# Patient Record
Sex: Female | Born: 1970 | Race: White | Hispanic: No | Marital: Married | Smoking: Current every day smoker
Health system: Southern US, Community
[De-identification: ages and names within clinical notes are randomized; demographics above are authoritative.]

## PROBLEM LIST (undated history)

## (undated) DIAGNOSIS — L309 Dermatitis, unspecified: Secondary | ICD-10-CM

## (undated) DIAGNOSIS — IMO0002 Reserved for concepts with insufficient information to code with codable children: Secondary | ICD-10-CM

## (undated) DIAGNOSIS — T7840XA Allergy, unspecified, initial encounter: Secondary | ICD-10-CM

## (undated) DIAGNOSIS — K219 Gastro-esophageal reflux disease without esophagitis: Secondary | ICD-10-CM

## (undated) DIAGNOSIS — F17200 Nicotine dependence, unspecified, uncomplicated: Secondary | ICD-10-CM

## (undated) DIAGNOSIS — Z9289 Personal history of other medical treatment: Secondary | ICD-10-CM

## (undated) DIAGNOSIS — Z01419 Encounter for gynecological examination (general) (routine) without abnormal findings: Secondary | ICD-10-CM

## (undated) DIAGNOSIS — L719 Rosacea, unspecified: Secondary | ICD-10-CM

## (undated) DIAGNOSIS — S0300XA Dislocation of jaw, unspecified side, initial encounter: Secondary | ICD-10-CM

## (undated) HISTORY — DX: Allergy, unspecified, initial encounter: T78.40XA

## (undated) HISTORY — DX: Dislocation of jaw, unspecified side, initial encounter: S03.00XA

## (undated) HISTORY — PX: DILATION AND CURETTAGE OF UTERUS: SHX78

## (undated) HISTORY — PX: KNEE SURGERY: SHX244

## (undated) HISTORY — DX: Reserved for concepts with insufficient information to code with codable children: IMO0002

## (undated) HISTORY — DX: Rosacea, unspecified: L71.9

## (undated) HISTORY — DX: Personal history of other medical treatment: Z92.89

## (undated) HISTORY — DX: Dermatitis, unspecified: L30.9

## (undated) HISTORY — PX: CHOLECYSTECTOMY: SHX55

## (undated) HISTORY — DX: Nicotine dependence, unspecified, uncomplicated: F17.200

## (undated) HISTORY — DX: Gastro-esophageal reflux disease without esophagitis: K21.9

## (undated) HISTORY — DX: Encounter for gynecological examination (general) (routine) without abnormal findings: Z01.419

---

## 2005-12-11 ENCOUNTER — Ambulatory Visit: Payer: Self-pay | Admitting: Family Medicine

## 2006-03-30 ENCOUNTER — Ambulatory Visit: Payer: Self-pay | Admitting: Family Medicine

## 2006-08-13 ENCOUNTER — Other Ambulatory Visit: Admission: RE | Admit: 2006-08-13 | Discharge: 2006-08-13 | Payer: Self-pay | Admitting: *Deleted

## 2006-08-26 ENCOUNTER — Encounter: Admission: RE | Admit: 2006-08-26 | Discharge: 2006-08-26 | Payer: Self-pay | Admitting: *Deleted

## 2006-10-06 ENCOUNTER — Ambulatory Visit: Payer: Self-pay | Admitting: Family Medicine

## 2006-12-29 ENCOUNTER — Ambulatory Visit: Payer: Self-pay | Admitting: Family Medicine

## 2007-04-20 ENCOUNTER — Ambulatory Visit: Payer: Self-pay | Admitting: Family Medicine

## 2007-10-18 ENCOUNTER — Ambulatory Visit: Payer: Self-pay | Admitting: Family Medicine

## 2007-10-18 ENCOUNTER — Other Ambulatory Visit: Admission: RE | Admit: 2007-10-18 | Discharge: 2007-10-18 | Payer: Self-pay | Admitting: *Deleted

## 2007-11-01 ENCOUNTER — Ambulatory Visit: Payer: Self-pay | Admitting: Family Medicine

## 2008-05-12 ENCOUNTER — Ambulatory Visit (HOSPITAL_COMMUNITY): Admission: RE | Admit: 2008-05-12 | Discharge: 2008-05-12 | Payer: Self-pay | Admitting: Orthopaedic Surgery

## 2008-07-25 ENCOUNTER — Ambulatory Visit: Payer: Self-pay | Admitting: Family Medicine

## 2008-09-12 ENCOUNTER — Ambulatory Visit: Payer: Self-pay | Admitting: Family Medicine

## 2008-11-28 ENCOUNTER — Encounter: Admission: RE | Admit: 2008-11-28 | Discharge: 2008-11-28 | Payer: Self-pay | Admitting: Gastroenterology

## 2009-02-23 ENCOUNTER — Ambulatory Visit: Payer: Self-pay | Admitting: Family Medicine

## 2009-08-01 ENCOUNTER — Ambulatory Visit: Payer: Self-pay | Admitting: Family Medicine

## 2009-08-31 ENCOUNTER — Ambulatory Visit: Payer: Self-pay | Admitting: Family Medicine

## 2009-09-28 ENCOUNTER — Ambulatory Visit: Payer: Self-pay | Admitting: Family Medicine

## 2009-10-29 ENCOUNTER — Ambulatory Visit: Payer: Self-pay | Admitting: Family Medicine

## 2010-11-09 ENCOUNTER — Encounter: Payer: Self-pay | Admitting: Family Medicine

## 2010-11-19 NOTE — Op Note (Signed)
Dana Wilkins, Dana Wilkins                 ACCOUNT NO.:  [id number]   MEDICAL RECORD NO.:  [id number]          PATIENT TYPE:  AMB   LOCATION:  SDS                          FACILITY:  [facility]   PHYSICIAN:  Vanita Panda. Magnus Ivan, M.D.DATE OF BIRTH:  August 07, 1970   DATE OF PROCEDURE:  05/12/2008  DATE OF DISCHARGE:  05/12/2008                               OPERATIVE REPORT   PREOPERATIVE DIAGNOSES:  1. Left knee patella chondromalacia.  2. Questionable left knee medial meniscal tear.  3. Mildly laterally tracking patella, left knee.   POSTOPERATIVE DIAGNOSES:  1. 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  2. No medial meniscal tear.  3. Mildly lateral tracking patella.   PROCEDURE:  Left knee arthroscopy with debridement and chondroplasty.   SURGEON:  Vanita Panda. Magnus Ivan, MD   ANESTHESIA:  1. Local left knee block.  2. Mask ventilation and IV sedation.   TOURNIQUET TIME:  None.   BLOOD LOSS:  Minimal.   COMPLICATIONS:  None.   INDICATIONS:  Briefly, Dana Wilkins is a 40 year old female who has been  having increasing worsening knee pain for some period of time.  She has  been on anti-inflammatory and glucosamine that helped.  An MRI showed  that she had lateral facet patella in the inferior pole having worn  collagen in lateral orientation of the patella in terms of subtracking.  There is also suggestion of a medial meniscal tear.  After failure of  conservative treatment, we recommended arthroscopy with debridement and  assessment of the medial meniscus as well.  The risks and benefits of  the surgery were explained to her as well as the possibility of a  lateral release, pending my findings were on during the time of case.  She agreed to proceed with surgery.   DESCRIPTION OF PROCEDURE:  After informed consent was obtained, the  appropriate left leg was marked, anesthesia obtained and left knee  block.  She was then brought to the operating room, placed  supine on the  operating table.  A nonsterile tourniquet was placed around her upper  left leg, and lateral leg post was utilized.  The leg was prepped and  draped with DuraPrep and sterile drapes including sterile stockinette.  With the bed raised, the time-out was called, and she was identified as  the correct patient and correct left knee.  I then made an anterior  lateral arthroscopy portal just lateral to the tendon.  Arthroscope was  inserted.  There was no effusion encountered.  I went right away to the  medial compartment and made an anteromedial arthroscopy portal with the  valgus stress with the knee flexed against the post, and external  rotation was able to probe the posterior horn of the medial meniscus and  found it to be intact.  It was a tight compartment, it was intact, and  there was minimal wear and tear of the cartilage of the femoral condyle  and tibial plateau, and again, this was very minimal.  There was no  exposed bone.  The ACL and PCL were intact  and probed with the knee in  the figure-four position.  The lateral compartment was intact.  There  was significant hypertrophy of the fat pad with the knee extended.  In  extension, you could see that there was buildup of tissue along the  lateral aspect of the gutter and lateral to the patella.  The patella  did track lateral, but this was minimal.  There was grade 2-3  chondromalacia in the patella.  With the arthroscopic shaver, I was able  to perform a chondroplasty over the patella back to smooth margins, and  I performed a minimal lateral release with just using arthroscopic  shaver and was easily able to remove the scar tissue from this area of  the knee.  I did debride the fat pad as well.  I removed all  instrumentation and allowed fluid to lavage the knee.  The portal site  was closed with interrupted 3-0 nylon suture.  The knee was infiltrated  with a mixture of Condyline, morphine, and Marcaine.  A  well-padded  sterile dressing was applied.  She was awakened, extubated, and taken to  recovery room in stable condition.      Vanita Panda. Magnus Ivan, M.D.  Electronically Signed     CYB/MEDQ  D:  05/12/2008  T:  05/12/2008  Job:  [id number]

## 2010-11-26 ENCOUNTER — Other Ambulatory Visit: Payer: Self-pay | Admitting: Gynecology

## 2010-11-26 DIAGNOSIS — R928 Other abnormal and inconclusive findings on diagnostic imaging of breast: Secondary | ICD-10-CM

## 2010-11-29 ENCOUNTER — Ambulatory Visit
Admission: RE | Admit: 2010-11-29 | Discharge: 2010-11-29 | Disposition: A | Discharge status: Home / Self Care | Payer: 59 | Source: Ambulatory Visit | Attending: Gynecology | Admitting: Gynecology

## 2010-11-29 DIAGNOSIS — R928 Other abnormal and inconclusive findings on diagnostic imaging of breast: Secondary | ICD-10-CM

## 2010-12-24 ENCOUNTER — Ambulatory Visit (INDEPENDENT_AMBULATORY_CARE_PROVIDER_SITE_OTHER): Payer: 59 | Admitting: Family Medicine

## 2010-12-24 ENCOUNTER — Encounter: Payer: Self-pay | Admitting: Family Medicine

## 2010-12-24 VITALS — BP 148/84 | HR 80 | Ht 65.5 in | Wt 213.0 lb

## 2010-12-24 DIAGNOSIS — J309 Allergic rhinitis, unspecified: Secondary | ICD-10-CM

## 2010-12-24 DIAGNOSIS — H6120 Impacted cerumen, unspecified ear: Secondary | ICD-10-CM

## 2010-12-24 DIAGNOSIS — H6980 Other specified disorders of Eustachian tube, unspecified ear: Secondary | ICD-10-CM

## 2010-12-24 NOTE — Patient Instructions (Signed)
Sinus rinses 1-2x/day (or Neti-pot).  Antihistamine and decongestant (ie. Claritin-D or Zyrtec-D or take separately).  Mucinex also recommended (plain).  You are at risk for having this turn into a sinus infection.  If your mucus turns discolored, along with increased sinus pain, fever, etc.  Call for antibiotics

## 2010-12-24 NOTE — Progress Notes (Signed)
Subjective:    Patient ID: Francine Graven, female    DOB: 05-16-1971, 40 y.o.   MRN: [id number]  HPI Patient presents with bilateral ear fullness since yesterday.  Having some sinus pressure in her forehead and cheeks.  Ears seem sensitive to certain noises.  Only slight runny nose, mainly draining back into her throat.  Mild cough.  Denies fevers.  Hasn't taken any over-the-counter medications. She has a history of allergies, usually getting 1 sinus infection each summer (August).  Successfully quit smoking with Chantix last year.  There was consolidation at work (people laid off), with a lot of increased stress, and she resumed smoking.  Exercising less, and back to smoking.  Wearing a pedometer and walking 1.5 miles a day, and is now on Weight Watchers.  She is not yet ready to quit smoking again.  Past Medical History  Diagnosis Date  . Allergy   . Tobacco use disorder   . TMJ (dislocation of temporomandibular joint)   . GERD (gastroesophageal reflux disease)     Past Surgical History  Procedure Date  . Cholecystectomy   . Knee surgery     Left--meniscal repair  . Dilation and curettage of uterus     History   Social History  . Marital Status: Married    Spouse Name: N/A    Number of Children: N/A  . Years of Education: N/A   Occupational History  . Not on file.   Social History Main Topics  . Smoking status: Current Everyday Smoker -- 1.0 packs/day for 20 years  . Smokeless tobacco: Never Used  . Alcohol Use: Yes     maybe once a month  . Drug Use: No  . Sexually Active: Not on file   Other Topics Concern  . Not on file   Social History Narrative  . No narrative on file    Family History  Problem Relation Age of Onset  . Cancer Mother     lung (smoker)  . Heart disease Father     CABG @ 17  . Diabetes Maternal Aunt   . Diabetes Paternal Aunt     breast  . Diabetes Maternal Aunt     breast cancer    Current outpatient prescriptions:calcium carbonate  (OS-CAL) 600 MG TABS, Take 600 mg by mouth daily.  , Disp: , Rfl: ;  cyclobenzaprine (FLEXERIL) 10 MG tablet, Take 10 mg by mouth daily.  , Disp: , Rfl: ;  omeprazole (PRILOSEC) 40 MG capsule, Take 40 mg by mouth daily.  , Disp: , Rfl: ;  Probiotic Product (PROBIOTIC ACIDOPHILUS PO), Take 1 tablet by mouth daily.  , Disp: , Rfl:   Allergies  Allergen Reactions  . Amoxicillin Diarrhea and Nausea Only  . Codeine Hives   Review of Systems Denies fevers.  Last week had some nausea-resolved.  No vomiting or diarrhea.  No skin rashes, urinary symptoms. Reflux is controlled    Objective:   Physical Exam BP 148/84  Pulse 80  Ht 5' 5.5" (1.664 m)  Wt 213 lb (96.616 kg)  BMI 34.91 kg/m2  LMP 12/23/2010 Well developed female in no distress.  Not coughing or congested sounding HEENT:  PERRL, EOMI, conjunctiva clear.  Nasal mucosa moderately edematous (L>R) with clear mucus and no erythema. R TM and EAC normal.  L TM partially obscured by cerumen. After lavage, large hard piece of cerumen was removed, and TM was visualized and was entirely normal.  Sinuses mildly tender x 4 Neck: no  lymphadenopathy or thyromegaly Heart:  Regular rate and rhythm without murmurs Lungs: clear bilaterally Skin: no rash Psych:  Normal mood, affect, hygiene and grooming      Assessment & Plan:   1. Cerumen impaction  Ear cerumen removal  2. Allergic rhinitis, cause unspecified    3. ETD (eustachian tube dysfunction)     Sinus rinses 1-2x/day (or Neti-pot).  Antihistamine and decongestant (ie. Claritin-D or Zyrtec-D or take separately).  Mucinex also recommended (plain).  Patient to call if symptoms persist/worsen/turn into full sinus infection, at which point she will need Rx for ABX.  Currently there is no evidence of bacterial infection.  Tobacco abuse--discussed the possibility of another course of Chantix when she is ready.  She can contact us for Rx.  Counseled regarding other smoking cessation options,  including free counseling, and free classes at the [facility].  Risks of smoking reviewed, and strongly encouraged tobacco cessation

## 2010-12-30 ENCOUNTER — Telehealth: Payer: Self-pay | Admitting: *Deleted

## 2010-12-30 DIAGNOSIS — J329 Chronic sinusitis, unspecified: Secondary | ICD-10-CM

## 2010-12-30 MED ORDER — AZITHROMYCIN 250 MG PO TABS: ORAL_TABLET | ORAL | Status: DC

## 2010-12-30 NOTE — Telephone Encounter (Signed)
Please advise pt that ABX are recommended, since symptoms worsening over the last week (and she is at risk for infection due to her smoking).  Please Rx Z-pak (no preferred pharmacy listed)

## 2010-12-30 NOTE — Telephone Encounter (Signed)
Patient called, stated that she had seen you last week for her sinuses. She was instructed to take Mucinex, Zyrtec-D and use a netti pot. She states that she has been taking the medications and did the netti pot on Tuesday evening successfully but starting on Wednesday when she attempted to use the netti pot, she was unable to as the water rolled back down her throat because her sinuses were so blocked. She says she still has sinus pressure in her ears and has had a HA that will not go away since Friday, she is also swollen across the bridge of her nose. Thanks.

## 2011-03-20 ENCOUNTER — Ambulatory Visit (INDEPENDENT_AMBULATORY_CARE_PROVIDER_SITE_OTHER): Payer: 59 | Admitting: Medical

## 2011-03-20 ENCOUNTER — Encounter: Payer: Self-pay | Admitting: Medical

## 2011-03-20 VITALS — BP 120/80 | HR 88 | Temp 98.2°F | Resp 20 | Ht 66.0 in | Wt 213.0 lb

## 2011-03-20 DIAGNOSIS — J309 Allergic rhinitis, unspecified: Secondary | ICD-10-CM

## 2011-03-20 DIAGNOSIS — F17200 Nicotine dependence, unspecified, uncomplicated: Secondary | ICD-10-CM

## 2011-03-20 DIAGNOSIS — J329 Chronic sinusitis, unspecified: Secondary | ICD-10-CM

## 2011-03-20 MED ORDER — CLARITHROMYCIN 500 MG PO TABS: 500.0000 mg | ORAL_TABLET | Freq: Two times a day (BID) | ORAL | Status: AC

## 2011-03-20 NOTE — Progress Notes (Signed)
Subjective:     Dana Wilkins is a 40 y.o. female who presents for evaluation of "sinus infection." She notes few day hx/o sinus pressure, headache, both green discharge and some bloody discharge from nose, nose stopped up, low grade fever, mild cough, swollen throat, and similar pattern in the past with sinusitis.  She also has seasonal allergies this time of year, just recently started back on allergy pill OTC.  She is using Mucinex as well.  She is on once daily Doxycycline for rosacea through dermatology.  Denies sick contacts.  Usually gest this when she travels back and forth to [place], and she was just there recently.  She is a smoker, has hx/o sinus problems.   No other aggravating or relieving factors.  No other c/o.  The following portions of the patient's history were reviewed and updated as appropriate: allergies, current medications, past family history, past medical history, past social history, past surgical history and problem list.  Past Medical History  Diagnosis Date  . Allergy   . Tobacco use disorder   . TMJ (dislocation of temporomandibular joint)   . GERD (gastroesophageal reflux disease)     Review of Systems Constitutional: +LGF; denies chills, sweats, anorexia Skin: denies rash HEENT: denies ear pain, itchy watery eyes Cardiovascular: denies chest pain Lungs: denies wheezing Abdomen: denies abdominal pain, nausea, vomiting, diarrhea GU: denies dysuria  Objective:   Filed Vitals:   03/20/11 1448  BP: 120/80  Pulse: 88  Temp: 98.2 F (36.8 C)  Resp: 20    General appearance: Alert, WD/WN, no distress                             Skin: warm, no rash                           Head: + frontal and maxillary sinus tenderness,                            Eyes: conjunctiva normal, corneas clear, PERRLA                            Ears: pearly TMs, external ear canals normal                          Nose: septum midline, turbinates swollen, left completely  blocked due to turbinate swelling             Mouth/throat: MMM, tongue normal, mild pharyngeal erythema, post nasal drip                           Neck: supple, no adenopathy, no thyromegaly, nontender                          Heart: RRR, normal S1, S2, no murmurs                         Lungs: CTA bilaterally, no wheezes, rales, or rhonchi      Assessment:   Encounter Diagnoses  Name Primary?  . Sinusitis Yes  . Allergic rhinitis   . Tobacco use disorder       Plan:   Advised that time  frame suggest viral etiology.  C/t Mucinex, allergy pill OTC, add nasal saline, rest, hydrate well, can use Afrin for 3 days or less, and if not improving in terms of worse symptoms or fever, over the next 3-5 days, then begin Biaxin.  Otherwise c/t current regimen.   Recommended she c/t efforts to stop smoking, discussed dangers of smoking, and she said both Dr. Susann Givens and Dr. Lynelle Doctor have encouraged her to stop smoking as well.

## 2011-04-08 LAB — CBC
HCT: 44.2
Hemoglobin: 15.3 — ABNORMAL HIGH
MCHC: 34.6
MCV: 89
Platelets: 330
RBC: 4.96
RDW: 13.4
WBC: 8.3

## 2011-04-08 LAB — BASIC METABOLIC PANEL
BUN: 8
CO2: 26
Calcium: 9.9
Chloride: 107
Creatinine, Ser: 0.85
GFR calc Af Amer: >60
GFR calc non Af Amer: >60
Glucose, Bld: 103 — ABNORMAL HIGH
Potassium: 4.4
Sodium: 140

## 2011-04-08 LAB — HCG, SERUM, QUALITATIVE: Preg, Serum: NEGATIVE

## 2011-04-22 ENCOUNTER — Telehealth: Payer: Self-pay | Admitting: Medical

## 2011-04-22 ENCOUNTER — Other Ambulatory Visit: Payer: Self-pay | Admitting: Medical

## 2011-04-22 MED ORDER — NYSTATIN 100000 UNIT/ML MT SUSP: 500000.0000 [IU] | Freq: Four times a day (QID) | OROMUCOSAL | Status: AC

## 2011-04-22 NOTE — Telephone Encounter (Signed)
Nystatin oral solution sent to pharmacy.  Take 1-2 wk, or at least 2 days after last bit of thrush noted in mouth.  Return if not improving.

## 2011-04-22 NOTE — Telephone Encounter (Signed)
PT INFORMED RX AT PHARMACY AND INSTRUCTIONS-LM

## 2011-06-20 ENCOUNTER — Encounter: Payer: Self-pay | Admitting: Family Medicine

## 2011-06-20 ENCOUNTER — Ambulatory Visit (INDEPENDENT_AMBULATORY_CARE_PROVIDER_SITE_OTHER): Payer: 59 | Admitting: Family Medicine

## 2011-06-20 DIAGNOSIS — R51 Headache: Secondary | ICD-10-CM

## 2011-06-20 DIAGNOSIS — J111 Influenza due to unidentified influenza virus with other respiratory manifestations: Secondary | ICD-10-CM

## 2011-06-20 DIAGNOSIS — R059 Cough, unspecified: Secondary | ICD-10-CM

## 2011-06-20 DIAGNOSIS — R509 Fever, unspecified: Secondary | ICD-10-CM

## 2011-06-20 DIAGNOSIS — R05 Cough: Secondary | ICD-10-CM

## 2011-06-20 LAB — POCT INFLUENZA A/B
Influenza A, POC: NEGATIVE
Influenza B, POC: NEGATIVE

## 2011-06-20 NOTE — Progress Notes (Signed)
  Subjective:    Patient ID: Dana Wilkins, female    DOB: [birth year], 40 y.o.   MRN: [id number]  HPI Approximately 3 days ago she developed a fever with chills and myalgias. The cough started the next day. She also had some sore throat and headache..  Review of Systems     Objective:   Physical Exam alert and in no distress. Tympanic membranes and canals are normal. Throat is clear. Tonsils are normal. Neck is supple without adenopathy or thyromegaly. Cardiac exam shows a regular sinus rhythm without murmurs or gallops. Lungs are clear to auscultation. Flu test is negative        Assessment & Plan:  Flu syndrome. Recommend alternating Tylenol with Aleve as well as drink plenty of fluids. She will call if further difficulty.

## 2011-06-20 NOTE — Patient Instructions (Signed)
Alternate Tylenol with 2 Aleve every 4-6 hours. Use NyQuil for nighttime purposes

## 2011-06-25 ENCOUNTER — Telehealth: Payer: Self-pay | Admitting: Family Medicine

## 2011-06-25 NOTE — Telephone Encounter (Signed)
Pt called and stated that she was better but is now coughing. She states that she is coughing up stuff. Pt uses [facility] [address]

## 2011-06-25 NOTE — Telephone Encounter (Signed)
I discussed the cough with the patient. She will continue using Robitussin-DM

## 2011-08-08 HISTORY — PX: COLPOSCOPY: SHX161

## 2011-09-29 ENCOUNTER — Encounter: Payer: Self-pay | Admitting: Medical

## 2011-09-29 ENCOUNTER — Ambulatory Visit (INDEPENDENT_AMBULATORY_CARE_PROVIDER_SITE_OTHER): Payer: 59 | Admitting: Medical

## 2011-09-29 VITALS — BP 138/80 | HR 76 | Temp 98.0°F | Resp 16 | Wt 222.0 lb

## 2011-09-29 DIAGNOSIS — Z7189 Other specified counseling: Secondary | ICD-10-CM

## 2011-09-29 DIAGNOSIS — F17200 Nicotine dependence, unspecified, uncomplicated: Secondary | ICD-10-CM

## 2011-09-29 DIAGNOSIS — Z716 Tobacco abuse counseling: Secondary | ICD-10-CM

## 2011-09-29 MED ORDER — ALPRAZOLAM 0.25 MG PO TABS: ORAL_TABLET | ORAL | Status: DC

## 2011-09-29 MED ORDER — VARENICLINE TARTRATE 0.5 MG X 11 & 1 MG X 42 PO MISC: ORAL | Status: DC

## 2011-09-29 NOTE — Progress Notes (Signed)
Subjective: Would like to try and quit smoking.  In the past she has used Chantix, nicotine patch but this made her sick.  Used Chantix a year and a half ago, had quit for 876mo.  She notes starting tobacco age 41, been smoking 20 years at a pack a day.  She notes that she felt better and had more energy in the past when quit smoking.  Tired of being bound by tobacco, limitations, and ready to get off tobacco.   She notes calling the toll free help line in the past.   She has been preparing her mind for this.  No other c/o.  The following portions of the patient's history were reviewed and updated as appropriate: allergies, current medications, past family history, past medical history, past social history, past surgical history and problem list.  Past Medical History  Diagnosis Date  . Allergy   . Tobacco use disorder   . TMJ (dislocation of temporomandibular joint)   . GERD (gastroesophageal reflux disease)     Allergies  Allergen Reactions  . Amoxicillin Diarrhea and Nausea Only  . Codeine Hives    Review of Systems Heart: negative Lungs: no SOB, wheezing ROS reviewed and was negative other than noted in HPI or above.    Objective:   Physical Exam  General appearance: alert, no distress, WD/WN Heart: RRR, normal S1, S2, no murmurs Lungs: CTA bilaterally, no wheezes, rhonchi, or rales   Assessment and Plan :     Encounter Diagnoses  Name Primary?  . Tobacco use disorder Yes  . Encounter for smoking cessation counseling    She will begin Chantix again, discussed strategies to help quit, advised she call and use the counseling through 1-800-QUIT-NOW.  She seems motivate today.  Recheck 76mo.

## 2011-09-29 NOTE — Patient Instructions (Signed)
YOU CAN QUIT SMOKING!  Talk to your medical provider about using medicines to help you quit. These include nicotine replacement gum, lozenges, or skin patches.  Consider calling 1-800-QUIT-NOW, a toll free 24/7 hotline with free counseling to help you quit.  If you are ready to quit smoking or are thinking about it, congratulations! You have chosen to help yourself be healthier and live longer! There are lots of different ways to quit smoking. Nicotine gum, nicotine patches, a nicotine inhaler, or nicotine nasal spray can help with physical craving. Hypnosis, support groups, and medicines help break the habit of smoking. TIPS TO GET OFF AND STAY OFF CIGARETTES  Learn to predict your moods. Do not let a bad situation be your excuse to have a cigarette. Some situations in your life might tempt you to have a cigarette.   Ask friends and co-workers not to smoke around you.   Make your home smoke-free.   Never have "just one" cigarette. It leads to wanting another and another. Remind yourself of your decision to quit.   On a card, make a list of your reasons for not smoking. Read it at least the same number of times a day as you have a cigarette. Tell yourself everyday, "I do not want to smoke. I choose not to smoke."   Ask someone at home or work to help you with your plan to quit smoking.   Have something planned after you eat or have a cup of coffee. Take a walk or get other exercise to perk you up. This will help to keep you from overeating.   Try a relaxation exercise to calm you down and decrease your stress. Remember, you may be tense and nervous the first two weeks after you quit. This will pass.   Find new activities to keep your hands busy. Play with a pen, coin, or rubber band. Doodle or draw things on paper.   Brush your teeth right after eating. This will help cut down the craving for the taste of tobacco after meals. You can try mouthwash too.   Try gum, breath mints, or diet  candy to keep something in your mouth.  IF YOU SMOKE AND WANT TO QUIT:  Do not stock up on cigarettes. Never buy a carton. Wait until one pack is finished before you buy another.   Never carry cigarettes with you at work or at home.   Keep cigarettes as far away from you as possible. Leave them with someone else.   Never carry matches or a lighter with you.   Ask yourself, "Do I need this cigarette or is this just a reflex?"   Bet with someone that you can quit. Put cigarette money in a piggy bank every morning. If you smoke, you give up the money. If you do not smoke, by the end of the week, you keep the money.   Keep trying. It takes 21 days to change a habit!  Document Released: 04/19/2009 Document Revised: 03/05/2011 Document Reviewed: 04/19/2009 [employer] Patient Information 2012 [employer].   

## 2011-10-31 ENCOUNTER — Encounter: Payer: Self-pay | Admitting: Medical

## 2011-10-31 ENCOUNTER — Ambulatory Visit (INDEPENDENT_AMBULATORY_CARE_PROVIDER_SITE_OTHER): Payer: 59 | Admitting: Medical

## 2011-10-31 VITALS — BP 138/80 | HR 72 | Temp 98.1°F | Resp 16 | Wt 224.0 lb

## 2011-10-31 DIAGNOSIS — R21 Rash and other nonspecific skin eruption: Secondary | ICD-10-CM

## 2011-10-31 DIAGNOSIS — F17200 Nicotine dependence, unspecified, uncomplicated: Secondary | ICD-10-CM

## 2011-10-31 MED ORDER — DOXYCYCLINE HYCLATE 100 MG PO TABS: 100.0000 mg | ORAL_TABLET | Freq: Two times a day (BID) | ORAL | Status: AC

## 2011-10-31 MED ORDER — TRIAMCINOLONE ACETONIDE 0.1 % EX CREA: TOPICAL_CREAM | Freq: Two times a day (BID) | CUTANEOUS | Status: DC

## 2011-10-31 MED ORDER — VARENICLINE TARTRATE 1 MG PO TABS: 1.0000 mg | ORAL_TABLET | Freq: Two times a day (BID) | ORAL | Status: DC

## 2011-10-31 NOTE — Patient Instructions (Signed)
Begin Doxycyline antibioti twice daily for 10 days to cover for infectoin.  Use the triamcinolone cream twice daily for rash.  Keep the area clean with soap and water.  You can take some Benadryl for itching and rash.    I refilled Chantix today.   Continue efforts to remain tobacco free.

## 2011-10-31 NOTE — Progress Notes (Signed)
Subjective:   HPI  Dana Wilkins is a 41 y.o. female who presents with redness on her arm.  She had been working out in the yard on Saturday and Sunday, but early in the week got a spot on her right forearm.  Denies any bite or injury, just noted spot.  She has been working out in the yard, [employer], and has been around [employer] as well.  Denies fever, nausea.  Using alcohol, peroxide, and hydrocortisone cream. Neither seemed to help, but alcohol made any discomfort go away.  Lesion has been itchy.  Was seeping clear fluid yesterday.  She notes prior reaction to poison ivy in remote past.  Last tetanus within 10 years.   Been smoke free 3 wk, still using Chantix.  Needs refill.  This time she is having much better time trying to quit tobacco, less anxiety.    No other c/o.  The following portions of the patient's history were reviewed and updated as appropriate: allergies, current medications, past family history, past medical history, past social history, past surgical history and problem list.  Past Medical History  Diagnosis Date  . Allergy   . Tobacco use disorder   . TMJ (dislocation of temporomandibular joint)   . GERD (gastroesophageal reflux disease)     Allergies  Allergen Reactions  . Amoxicillin Diarrhea and Nausea Only  . Codeine Hives     Review of Systems ROS reviewed and was negative other than noted in HPI or above.    Objective:   Physical Exam  General appearence: alert, no distress, WD/WN Skin: right medial forearm distally with round raised serous colored crusting and erythematous rash.  nonspecific appearing, could be plant dermatitis vs insect bite.  No warmth, induration or fluctuance.   Assessment and Plan :     Encounter Diagnoses  Name Primary?  . Rash Yes  . Tobacco use disorder    Rash nonspecific.  Given symptoms, begin Doxycycline, triamcinolone topically, benadryl OTC and call/return if worse or not improving in next 3-5 days.  C/t  chantix, glad to hear she is having such good success on chantix.

## 2011-11-05 DIAGNOSIS — Z9289 Personal history of other medical treatment: Secondary | ICD-10-CM

## 2011-11-05 HISTORY — DX: Personal history of other medical treatment: Z92.89

## 2011-12-30 ENCOUNTER — Encounter: Payer: Self-pay | Admitting: Medical

## 2011-12-30 ENCOUNTER — Ambulatory Visit (INDEPENDENT_AMBULATORY_CARE_PROVIDER_SITE_OTHER): Payer: 59 | Admitting: Medical

## 2011-12-30 VITALS — BP 122/88 | HR 60 | Temp 98.1°F | Resp 16 | Wt 233.0 lb

## 2011-12-30 DIAGNOSIS — J329 Chronic sinusitis, unspecified: Secondary | ICD-10-CM

## 2011-12-30 MED ORDER — CLARITHROMYCIN 500 MG PO TABS: 500.0000 mg | ORAL_TABLET | Freq: Two times a day (BID) | ORAL | Status: AC

## 2011-12-30 NOTE — Progress Notes (Signed)
Subjective: Here for possible sinus infection.  She reports 56mo hx/o intermittent ear drainage, clear, itchy ears, but in the last week notes sinus pressure, low hum in the ears, painful ears at times, left part of nose and face seems swollen, having tooth pain, worse with leaning head forward, some sneezing, cough, post nasal drip.  Denies chest congestion.  Denies fever, chills, NVD, productive sputum.  Using Zyrtec for symptoms.  Denies sick contacts.  No other aggravating or relieving factors.  No other c/o.   Objective:   Filed Vitals:   12/30/11 1107  BP: 122/88  Pulse: 60  Temp: 98.1 F (36.7 C)  Resp: 16    General appearance: Alert, WD/WN, no distress                             Skin: warm, no rash                           Head: + left maxillary sinus tenderness,                            Eyes: conjunctiva normal, corneas clear, PERRLA                            Ears: pearly TMs, external ear canals normal                          Nose: septum midline, turbinates swollen, with erythema and clear discharge             Mouth/throat: MMM, tongue normal, mild pharyngeal erythema                           Neck: supple, no adenopathy, no thyromegaly, nontender                          Heart: RRR, normal S1, S2, no murmurs                         Lungs: CTA bilaterally, no wheezes, rales, or rhonchi      Assessment and Plan:   Encounter Diagnosis  Name Primary?  . Sinusitis Yes   Prescription given for Biaxin.  Can use OTC Mucinex for congestion.  Tylenol or Ibuprofen OTC for fever and malaise.  Discussed symptomatic relief, nasal saline, and call or return if worse or not improving in 2-3 days.

## 2011-12-31 ENCOUNTER — Encounter: Payer: Self-pay | Admitting: Medical

## 2012-02-23 ENCOUNTER — Ambulatory Visit (INDEPENDENT_AMBULATORY_CARE_PROVIDER_SITE_OTHER): Payer: 59 | Admitting: Medical

## 2012-02-23 ENCOUNTER — Encounter: Payer: Self-pay | Admitting: Medical

## 2012-02-23 ENCOUNTER — Telehealth: Payer: Self-pay | Admitting: Internal Medicine

## 2012-02-23 VITALS — BP 120/80 | HR 60 | Temp 98.1°F | Resp 16 | Wt 230.0 lb

## 2012-02-23 DIAGNOSIS — H60399 Other infective otitis externa, unspecified ear: Secondary | ICD-10-CM

## 2012-02-23 DIAGNOSIS — J329 Chronic sinusitis, unspecified: Secondary | ICD-10-CM

## 2012-02-23 DIAGNOSIS — H6090 Unspecified otitis externa, unspecified ear: Secondary | ICD-10-CM

## 2012-02-23 MED ORDER — CIPROFLOXACIN HCL 500 MG PO TABS: 500.0000 mg | ORAL_TABLET | Freq: Two times a day (BID) | ORAL | Status: AC

## 2012-02-23 MED ORDER — CIPROFLOXACIN-HYDROCORTISONE 0.2-1 % OT SUSP: 3.0000 [drp] | Freq: Two times a day (BID) | OTIC | Status: DC

## 2012-02-23 NOTE — Telephone Encounter (Signed)
Have her come back in.  She was having intermittent ear drainage, but when I saw her the ears looked fine inside.  She may have either swimmers ear or wax issues.  Let me look at them again.  Any fever, pain?

## 2012-02-23 NOTE — Progress Notes (Signed)
  Subjective:   HPI  Dana Wilkins is a 41 y.o. female who presents for ear c/o.  I saw her recently for sinus infection and 1 mo hx/o intermittent ear drainage.  She did get some better with Biaxin, but sinus pressure didn't completley resolve.  Main c/o now though is ongoing bilat ear drainage, odorous and crusty discharge, wakes up with ears and hair crusted.  getting some ear swelling and pain bilat, worse on the left.  Denies any recent swimming, but has had swimmers ear a few times in the past .  No other aggravating or relieving factors.    No other c/o.  The following portions of the patient's history were reviewed and updated as appropriate: allergies, current medications, past family history, past medical history, past social history, past surgical history and problem list.  Past Medical History  Diagnosis Date  . Allergy   . Tobacco use disorder   . TMJ (dislocation of temporomandibular joint)   . GERD (gastroesophageal reflux disease)     Allergies  Allergen Reactions  . Amoxicillin Diarrhea and Nausea Only  . Codeine Hives     Review of Systems ROS reviewed and was negative other than noted in HPI or above.    Objective:   Physical Exam  General appearance: alert, no distress, WD/WN HEENT: normocephalic, no obvious facial or ear swelling or erythema, sclerae anicteric,  bilat ear canals with erythema, crusting drainage, and some purulent debris in both canals, mild swelling of canals, TMs appear normal, left turbinate swollen and red, but otherwise pharynx normal Oral cavity: MMM, no lesions Neck: supple, no lymphadenopathy, no thyromegaly, no masses   Assessment and Plan :     Encounter Diagnoses  Name Primary?  . Otitis externa Yes  . Sinusitis    Begin Cipro oral and Cipro HC drops, hydrate well, Mucinex D OTC, nasal saline.  Call back Friday, return if not improving.

## 2012-02-23 NOTE — Telephone Encounter (Signed)
Pt coming in

## 2012-02-23 NOTE — Patient Instructions (Addendum)
I am treating you for external ear infection and residual sinus infection too.  Begin Cipro HC drops, 3 drops in each ear twice daily.  Avoid getting the ears wet for now.  You can use cotton balls in the external ear canal.  Begin Cipro oral antibiotic as well, twice daily for 5 days.  Drink plenty of water throughout the day.  Use nasal saline flush such as neti pot.  Consider plain Mucinex or Mucinex D twice daily for 5-7 days.    If not improving, let me know.    Otitis Externa Otitis externa ("swimmer's ear") is a germ (bacterial) or fungal infection of the outer ear canal (from the eardrum to the outside of the ear). Swimming in dirty water may cause swimmer's ear. It also may be caused by moisture in the ear from water remaining after swimming or bathing. Often the first signs of infection may be itching in the ear canal. This may progress to ear canal swelling, redness, and pus drainage, which may be signs of infection. HOME CARE INSTRUCTIONS   Apply the antibiotic drops to the ear canal as prescribed by your doctor.   This can be a very painful medical condition. A strong pain reliever may be prescribed.   Only take over-the-counter or prescription medicines for pain, discomfort, or fever as directed by your caregiver.   If your caregiver has given you a follow-up appointment, it is very important to keep that appointment. Not keeping the appointment could result in a chronic or permanent injury, pain, hearing loss and disability. If there is any problem keeping the appointment, you must call back to this facility for assistance.  PREVENTION   It is important to keep your ear dry. Use the corner of a towel to wick water out of the ear canal after swimming or bathing.   Avoid scratching in your ear. This can damage the ear canal or remove the protective wax lining the canal and make it easier for germs (bacteria) or a fungus to grow.   You may use ear drops made of rubbing alcohol and  vinegar after swimming to prevent future "swimmer's ear" infections. Make up a small bottle of equal parts white vinegar and alcohol. Put 3 or 4 drops into each ear after swimming.   Avoid swimming in lakes, polluted water, or poorly chlorinated pools.  SEEK MEDICAL CARE IF:   An oral temperature above 102 F (38.9 C) develops.   Your ear is still painful after 3 days and shows signs of getting worse (redness, swelling, pain, or pus).  MAKE SURE YOU:   Understand these instructions.   Will watch your condition.   Will get help right away if you are not doing well or get worse.  Document Released: 06/23/2005 Document Revised: 06/12/2011 Document Reviewed: 01/28/2008 [facility] Patient Information 2012 [place], [place].

## 2012-02-24 ENCOUNTER — Other Ambulatory Visit: Payer: Self-pay | Admitting: Medical

## 2012-02-24 ENCOUNTER — Telehealth: Payer: Self-pay | Admitting: Internal Medicine

## 2012-02-24 MED ORDER — TOBRAMYCIN-DEXAMETHASONE 0.3-0.1 % OP SUSP: OPHTHALMIC | Status: DC

## 2012-02-24 NOTE — Telephone Encounter (Signed)
Patient was notified that a new medication was sent to the pharmacy. CLS

## 2012-02-24 NOTE — Telephone Encounter (Signed)
i sent a different ear drop

## 2012-04-21 ENCOUNTER — Telehealth: Payer: Self-pay | Admitting: Medical

## 2012-04-21 ENCOUNTER — Other Ambulatory Visit: Payer: Self-pay | Admitting: Medical

## 2012-04-21 MED ORDER — TOBRAMYCIN-DEXAMETHASONE 0.3-0.1 % OP SUSP: OPHTHALMIC | Status: DC

## 2012-04-21 NOTE — Telephone Encounter (Signed)
SHANE SENT SCRIPT TO PHARMACY

## 2012-05-07 DIAGNOSIS — R87619 Unspecified abnormal cytological findings in specimens from cervix uteri: Secondary | ICD-10-CM

## 2012-05-07 DIAGNOSIS — IMO0002 Reserved for concepts with insufficient information to code with codable children: Secondary | ICD-10-CM

## 2012-05-07 DIAGNOSIS — Z01419 Encounter for gynecological examination (general) (routine) without abnormal findings: Secondary | ICD-10-CM

## 2012-05-07 HISTORY — DX: Unspecified abnormal cytological findings in specimens from cervix uteri: R87.619

## 2012-05-07 HISTORY — PX: SHOULDER ARTHROSCOPY: SHX128

## 2012-05-07 HISTORY — DX: Encounter for gynecological examination (general) (routine) without abnormal findings: Z01.419

## 2012-05-07 HISTORY — DX: Reserved for concepts with insufficient information to code with codable children: IMO0002

## 2012-06-07 ENCOUNTER — Ambulatory Visit (INDEPENDENT_AMBULATORY_CARE_PROVIDER_SITE_OTHER): Payer: 59 | Admitting: Medical

## 2012-06-07 ENCOUNTER — Encounter: Payer: Self-pay | Admitting: Medical

## 2012-06-07 VITALS — BP 128/82 | HR 86 | Resp 12 | Ht 65.0 in | Wt 239.0 lb

## 2012-06-07 DIAGNOSIS — L259 Unspecified contact dermatitis, unspecified cause: Secondary | ICD-10-CM

## 2012-06-07 DIAGNOSIS — Z0000 Encounter for general adult medical examination without abnormal findings: Secondary | ICD-10-CM

## 2012-06-07 DIAGNOSIS — H9319 Tinnitus, unspecified ear: Secondary | ICD-10-CM

## 2012-06-07 DIAGNOSIS — L309 Dermatitis, unspecified: Secondary | ICD-10-CM

## 2012-06-07 DIAGNOSIS — E669 Obesity, unspecified: Secondary | ICD-10-CM

## 2012-06-07 DIAGNOSIS — R002 Palpitations: Secondary | ICD-10-CM

## 2012-06-07 DIAGNOSIS — F17200 Nicotine dependence, unspecified, uncomplicated: Secondary | ICD-10-CM

## 2012-06-07 LAB — CBC WITH DIFFERENTIAL/PLATELET
Basophils Absolute: 0.1 10*3/uL (ref 0.0–0.1)
Basophils Relative: 1 % (ref 0–1)
Eosinophils Absolute: 0.3 10*3/uL (ref 0.0–0.7)
Eosinophils Relative: 4 % (ref 0–5)
HCT: 43.8 % (ref 36.0–46.0)
Hemoglobin: 15.2 g/dL — ABNORMAL HIGH (ref 12.0–15.0)
Lymphocytes Relative: 22 % (ref 12–46)
Lymphs Abs: 1.9 10*3/uL (ref 0.7–4.0)
MCH: 28.7 pg (ref 26.0–34.0)
MCHC: 34.7 g/dL (ref 30.0–36.0)
MCV: 82.6 fL (ref 78.0–100.0)
Monocytes Absolute: 0.6 10*3/uL (ref 0.1–1.0)
Monocytes Relative: 7 % (ref 3–12)
Neutro Abs: 5.6 10*3/uL (ref 1.7–7.7)
Neutrophils Relative %: 66 % (ref 43–77)
Platelets: 417 10*3/uL — ABNORMAL HIGH (ref 150–400)
RBC: 5.3 MIL/uL — ABNORMAL HIGH (ref 3.87–5.11)
RDW: 13.7 % (ref 11.5–15.5)
WBC: 8.5 10*3/uL (ref 4.0–10.5)

## 2012-06-07 LAB — TSH: TSH: 1.002 u[IU]/mL (ref 0.350–4.500)

## 2012-06-07 MED ORDER — VARENICLINE TARTRATE 1 MG PO TABS: 1.0000 mg | ORAL_TABLET | Freq: Two times a day (BID) | ORAL | Status: DC

## 2012-06-07 NOTE — Progress Notes (Signed)
Subjective:   HPI  Dana Wilkins is a 41 y.o. female who presents for a complete physical.   Preventative care: Last ophthalmology visit:01/2012 Last dental visit:yes/ Dr. Nedra Hai in 8/13 Last colonoscopy:n/a Last mammogram:11/2011 Last gynecological exam:05/2012, f/u pending with gynecology due to recent abnormal pap Last EKG:n/a Last labs:07/2007  Prior vaccinations: TD or Tdap:8 to 9 years ago Influenza:n/a, declines Pneumococcal:n/a Shingles/Zostavax:n/a  Advanced directive:n/a Health care power of attorney:n/a Living will:n/a  Concerns: Wants to try and quit tobacco again.   Was doing well, had quit for 633mo this year along with Chantix, but given pressures of work and schedule, started back tobacco.  Had vivid dreams, but no other adverse effects.  Still has issues with ears, has drainage.  Gets tonsil stones. Ears at times have itching and discomfort, but not necessarily pain.    Been getting occasional funny beat of heart.  No pain, no SOB, no faint feeling, jut has gotten some of these at random in the last 33mo.  Drinks 3 large cups of coffee daily, sometimes some additional hot tea as well.   Reviewed their medical, surgical, family, social, medication, and allergy history and updated chart as appropriate.   Past Medical History  Diagnosis Date  . Allergy   . Tobacco use disorder   . TMJ (dislocation of temporomandibular joint)   . GERD (gastroesophageal reflux disease)   . Routine gynecological examination 05/2012    Dr. Lorenso Courier  . History of mammogram 5/13    normal  . Abnormal finding on Pap smear 05/2012    Dr. Lorenso Courier  . Eczema     hands  . Rosacea     Dr. Terri Piedra    Past Surgical History  Procedure Date  . Cholecystectomy   . Knee surgery     Left--meniscal repair  . Dilation and curettage of uterus   . Shoulder arthroscopy 05/2012    left, Dr. Rayburn Ma, [facility]  . Colposcopy 2/13  . Colonoscopy     never    Family History  Problem  Relation Age of Onset  . Cancer Mother     lung (smoker)  . Heart disease Father     CABG @ 98  . Diabetes Maternal Aunt   . Diabetes Paternal Aunt     breast  . Diabetes Maternal Aunt     breast cancer  . Stroke Neg Hx   . Hypertension Neg Hx     History   Social History  . Marital Status: Married    Spouse Name: N/A    Number of Children: N/A  . Years of Education: N/A   Occupational History  . Not on file.   Social History Main Topics  . Smoking status: Current Every Day Smoker -- 1.0 packs/day for 20 years  . Smokeless tobacco: Never Used  . Alcohol Use: 0.0 oz/week    0 drink(s) per week     Comment: maybe once a month  . Drug Use: No  . Sexually Active: Not on file   Other Topics Concern  . Not on file   Social History Narrative   Married, no children, walking twice weekly.  Works as Theatre stage manager     Current Outpatient Prescriptions on File Prior to Visit  Medication Sig Dispense Refill  . calcium carbonate (OS-CAL) 600 MG TABS Take 600 mg by mouth daily.        Marland Kitchen ibuprofen (ADVIL,MOTRIN) 200 MG tablet Take 200 mg by mouth every 6 (six) hours as  needed.      . loratadine (CLARITIN) 10 MG tablet Take 10 mg by mouth daily.      Marland Kitchen omeprazole (PRILOSEC) 40 MG capsule Take 40 mg by mouth daily.        . Probiotic Product (ALIGN PO) Take by mouth.      . tobramycin-dexamethasone (TOBRADEX) ophthalmic solution Use in both ears, 2 drops every 4 hours in each ear for 2 days, then every 6 hours x 5 days  10 mL  0    Allergies  Allergen Reactions  . Amoxicillin Diarrhea and Nausea Only  . Codeine Hives    Review of Systems Constitutional: -fever, -chills, -sweats, +unexpected weight change, -decreased appetite, -fatigue Allergy: +sneezing, -itching, +congestion Dermatology: -changing moles, --rash, -lumps ENT: -runny nose, +ear pain, -sore throat, -hoarseness, -sinus pain, -teeth pain, - ringing in ears, -hearing loss, -nosebleeds Cardiology: -chest pain,  -palpitations, -swelling, -difficulty breathing when lying flat, -waking up short of breath Respiratory: +cough, -shortness of breath, -difficulty breathing with exercise or exertion, -wheezing, -coughing up blood Gastroenterology: -abdominal pain, -nausea, -vomiting, -diarrhea, -constipation, -blood in stool, -changes in bowel movement, -difficulty swallowing or eating Hematology: -bleeding, -bruising  Musculoskeletal: +joint aches, -muscle aches, -joint swelling, -back pain, -neck pain, -cramping, -changes in gait Ophthalmology: denies vision changes, eye redness, itching, discharge Urology: -burning with urination, -difficulty urinating, -blood in urine, -urinary frequency, -urgency, -incontinence Neurology: +headache, -weakness, -tingling, -numbness, -memory loss, -falls, -dizziness Psychology: -depressed mood, -agitation, -sleep problems     Objective:   Physical Exam  Filed Vitals:   06/07/12 0829  BP: 128/82  Pulse: 86  Resp: 12    General appearance: alert, no distress, WD/WN, white female Skin: scattered benign appearing macules, bilat external ear canals with mild erythema and flaking, suggestive of eczema, no pus, no odor, no obvious otitis external HEENT: normocephalic, conjunctiva/corneas normal, sclerae anicteric, PERRLA, EOMi, nares patent, no discharge or erythema, pharynx normal Oral cavity: MMM, tongue normal, teeth in good repair Neck: supple, no lymphadenopathy, no thyromegaly, no masses, normal ROM, no bruits Chest: non tender, normal shape and expansion Heart: RRR, normal S1, S2, no murmurs Lungs: CTA bilaterally, no wheezes, rhonchi, or rales Abdomen: +bs, soft, non tender, non distended, no masses, no hepatomegaly, no splenomegaly, no bruits Back: non tender, normal ROM, no scoliosis Musculoskeletal: upper extremities non tender, no obvious deformity, normal ROM throughout, lower extremities non tender, no obvious deformity, normal ROM throughout Extremities:  no edema, no cyanosis, no clubbing Pulses: 2+ symmetric, upper and lower extremities, normal cap refill Neurological: alert, oriented x 3, CN2-12 intact, strength normal upper extremities and lower extremities, sensation normal throughout, DTRs 2+ throughout, no cerebellar signs, gait normal Psychiatric: normal affect, behavior normal, pleasant  Breast, gyn, rectal - deferred to gynecology   Adult ECG Report  Indication: palpitation  Rate: 78 bpm  Rhythm: normal sinus rhythm  QRS Axis: 14 degrees  PR Interval: 128 ms  QRS Duration: 94 ms  QTc: 433 ms  Conduction Disturbances: none  Other Abnormalities: ST depression II rhythm lead out of place and not in other leads, seems to be anomaly  Patient's cardiac risk factors are: obesity (BMI >= 30 kg/m2) and smoking/ tobacco exposure.  EKG comparison: none  Narrative Interpretation: normal EKG   Assessment and Plan :    Encounter Diagnoses  Name Primary?  . Routine general medical examination at a health care facility Yes  . Palpitations   . Tobacco use disorder   . Tinnitus   . Eczema   .  Obesity     Physical exam - discussed healthy lifestyle, diet, exercise, preventative care, vaccinations, and addressed their concerns.  Handout given.  Palpitations - EKG unremarkable.  Advised she gradually back off caffeine.  Drinks a lot of caffeine currently.  If palpitations worsen or get more frequent, then recheck.  Tobacco use - restart chantix, try and keep discipline approach to quit.  She was having success earlier this year.  counseled on tobacco cessation.  tinnitus - mild, occasional, hearing test normal.  If problem persists, consider nasal spray as trial.  C/t Claritin for now.  Eczema - followed by dermatology about hands, but eternal ear canals have rash suggestive of eczema.  Try OTC Cortaid for this for the next week and see how she responds.  Obesity - she had done well prior this past year on weight watchers.  Encouraged  her to get things back in gear with exercise, diet, consider weight watchers again.  Follow-up pending labs

## 2012-06-07 NOTE — Patient Instructions (Signed)

## 2012-06-08 LAB — COMPREHENSIVE METABOLIC PANEL
ALT: 16 U/L (ref 0–35)
AST: 14 U/L (ref 0–37)
Albumin: 4.4 g/dL (ref 3.5–5.2)
Alkaline Phosphatase: 47 U/L (ref 39–117)
BUN: 10 mg/dL (ref 6–23)
CO2: 24 mEq/L (ref 19–32)
Calcium: 9.7 mg/dL (ref 8.4–10.5)
Chloride: 105 mEq/L (ref 96–112)
Creat: 0.85 mg/dL (ref 0.50–1.10)
Glucose, Bld: 96 mg/dL (ref 70–99)
Potassium: 5.3 mEq/L (ref 3.5–5.3)
Sodium: 141 mEq/L (ref 135–145)
Total Bilirubin: 0.4 mg/dL (ref 0.3–1.2)
Total Protein: 6.6 g/dL (ref 6.0–8.3)

## 2012-06-08 LAB — LIPID PANEL
Cholesterol: 180 mg/dL (ref 0–200)
HDL: 35 mg/dL — ABNORMAL LOW (ref >39)
LDL Cholesterol: 120 mg/dL — ABNORMAL HIGH (ref 0–99)
Total CHOL/HDL Ratio: 5.1 Ratio
Triglycerides: 124 mg/dL (ref <150)
VLDL: 25 mg/dL (ref 0–40)

## 2012-06-08 LAB — VITAMIN D 25 HYDROXY (VIT D DEFICIENCY, FRACTURES): Vit D, 25-Hydroxy: 33 ng/mL (ref 30–89)

## 2012-08-02 ENCOUNTER — Encounter: Payer: Self-pay | Admitting: Family Medicine

## 2012-08-02 ENCOUNTER — Ambulatory Visit (INDEPENDENT_AMBULATORY_CARE_PROVIDER_SITE_OTHER): Payer: BC Managed Care – PPO | Admitting: Family Medicine

## 2012-08-02 VITALS — BP 140/80 | HR 64 | Temp 98.6°F | Ht 65.5 in | Wt 243.0 lb

## 2012-08-02 DIAGNOSIS — R300 Dysuria: Secondary | ICD-10-CM

## 2012-08-02 LAB — POCT URINALYSIS DIPSTICK
Bilirubin, UA: NEGATIVE
Blood, UA: NEGATIVE
Glucose, UA: NEGATIVE
Ketones, UA: NEGATIVE
Nitrite, UA: NEGATIVE
Protein, UA: NEGATIVE
Spec Grav, UA: 1.01
Urobilinogen, UA: NEGATIVE
pH, UA: 7

## 2012-08-02 MED ORDER — CIPROFLOXACIN HCL 250 MG PO TABS: 250.0000 mg | ORAL_TABLET | Freq: Two times a day (BID) | ORAL | Status: DC

## 2012-08-02 NOTE — Patient Instructions (Signed)
Take the antibiotics twice daily for at least 3 days (take for the full 5 days--stop at 3 only if not tolerating due to side effects, or if we call with your results prior). Drink plenty of fluids  Return if you develop fever, flank pain, or vomiting

## 2012-08-02 NOTE — Progress Notes (Signed)
Chief Complaint  Patient presents with  . Urinary Tract Infection    woke up Saturday morning with burning with urination. Low back pain, urgency and frequency. UA showed trace leuks.  Very nauseous today and somewhat yesterday.   HPI:  Woke up 2 mornings ago to go to bathroom, and noted some low back pain, dysuria, urgency.  She has had some ongoing urinary urgency and frequency.  Not as painful when she voids as it was on the first day.  Ongoing nausea since Saturday.  No vomiting, no diarrhea.  No devers.  No sick contacts--husband recently got over a GI bug.  H/o UTI in past, but very infrequent, not recent.  Feels similar to previous UTI. Denies abnormal vaginal discharge--no odor or itching.  Monogamous relationship  Past Medical History  Diagnosis Date  . Allergy   . Tobacco use disorder     quit 06/2012  . TMJ (dislocation of temporomandibular joint)   . GERD (gastroesophageal reflux disease)   . Routine gynecological examination 05/2012    Dr. Lorenso Courier  . History of mammogram 5/13    normal  . Abnormal finding on Pap smear 05/2012    Dr. Lorenso Courier  . Eczema     hands  . Rosacea     Dr. Terri Piedra   Past Surgical History  Procedure Date  . Cholecystectomy   . Knee surgery     Left--meniscal repair  . Dilation and curettage of uterus   . Shoulder arthroscopy 05/2012    left, Dr. Rayburn Ma, [facility]  . Colposcopy 2/13   History   Social History  . Marital Status: Married    Spouse Name: N/A    Number of Children: N/A  . Years of Education: N/A   Occupational History  . Not on file.   Social History Main Topics  . Smoking status: Former Smoker -- 1.0 packs/day for 20 years    Start date: 06/22/2012  . Smokeless tobacco: Never Used  . Alcohol Use: 0.0 oz/week    0 drink(s) per week     Comment: maybe once a month  . Drug Use: No  . Sexually Active: Not on file   Other Topics Concern  . Not on file   Social History Narrative   Married, no  children, walking twice weekly.  Works as Theatre stage manager    Current outpatient prescriptions:Calcium-Vitamin D-Vitamin K 500-100-40 MG-UNT-MCG CHEW, Chew 1 each by mouth daily., Disp: , Rfl: ;  clobetasol cream (TEMOVATE) 0.05 %, Apply 1 application topically as needed., Disp: , Rfl: ;  cyclobenzaprine (FLEXERIL) 10 MG tablet, Take 10 mg by mouth 3 (three) times daily as needed., Disp: , Rfl: ;  ibuprofen (ADVIL,MOTRIN) 200 MG tablet, Take 200 mg by mouth every 6 (six) hours as needed., Disp: , Rfl:  loratadine (CLARITIN) 10 MG tablet, Take 10 mg by mouth daily., Disp: , Rfl: ;  metroNIDAZOLE (METROGEL) 0.75 % gel, Apply topically daily., Disp: , Rfl: ;  omeprazole (PRILOSEC) 40 MG capsule, Take 40 mg by mouth daily.  , Disp: , Rfl: ;  Probiotic Product (ALIGN PO), Take by mouth., Disp: , Rfl: ;  ciprofloxacin (CIPRO) 250 MG tablet, Take 1 tablet (250 mg total) by mouth 2 (two) times daily., Disp: 10 tablet, Rfl: 0 (Cipro just started after this visit)  Allergies  Allergen Reactions  . Amoxicillin Diarrhea and Nausea Only  . Codeine Hives   ROS:  Denies fevers, flank pain, vomiting, diarrhea (+nause).  +low back pain.  Denies  skin rash, chest pain, headaches, dizziness, vaginal discharge, bleeding, bruising, rash or other concerns.  PHYSICAL EXAM: BP 140/80  Pulse 64  Temp 98.6 F (37 C) (Oral)  Ht 5' 5.5" (1.664 m)  Wt 243 lb (110.224 kg)  BMI 39.82 kg/m2  LMP 06/28/2012 Well developed, pleasant female in no distress Back: no CVA tenderness.  Area of discomfort is in paraspinous muscles.   Heart: regular rate and rhythm Lungs: clear Abdomen: soft, nontender, no mass Skin: no rash Psych: normal mood, affect, hygiene and grooming  Urine dip: trace leuks, SG 1.010. Otherwise negative.  ASSESSMENT/PLAN: 1. Burning with urination  POCT Urinalysis Dipstick, Urine culture, ciprofloxacin (CIPRO) 250 MG tablet   Symptoms consistent with UTI, but dip only minimally abnormal.  Will treat  presumptively with cipro while awaiting urine culture results.  Risks/side effects reviewed.  Call/return if fevers, vomiting, blank pain, hematuria or other concerns develop before culture results return  Use tylenol or ibuprofen as needed for pain, heat prn low back pain.

## 2012-08-05 LAB — URINE CULTURE: Colony Count: >=100000

## 2013-01-28 ENCOUNTER — Ambulatory Visit (INDEPENDENT_AMBULATORY_CARE_PROVIDER_SITE_OTHER): Payer: BC Managed Care – PPO | Admitting: Medical

## 2013-01-28 ENCOUNTER — Encounter: Payer: Self-pay | Admitting: Medical

## 2013-01-28 VITALS — BP 118/80 | HR 76 | Temp 98.1°F | Resp 16 | Wt 248.0 lb

## 2013-01-28 DIAGNOSIS — M62830 Muscle spasm of back: Secondary | ICD-10-CM

## 2013-01-28 DIAGNOSIS — S39012A Strain of muscle, fascia and tendon of lower back, initial encounter: Secondary | ICD-10-CM

## 2013-01-28 DIAGNOSIS — M5380 Other specified dorsopathies, site unspecified: Secondary | ICD-10-CM

## 2013-01-28 DIAGNOSIS — IMO0002 Reserved for concepts with insufficient information to code with codable children: Secondary | ICD-10-CM

## 2013-01-28 MED ORDER — CYCLOBENZAPRINE HCL 10 MG PO TABS: ORAL_TABLET | ORAL | Status: DC

## 2013-01-28 NOTE — Progress Notes (Signed)
Subjective: Here for neck and upper back pain since Monday.    When driving and accelerating, feels catch in back on right between shoulder blade.  Pain radiates up her neck.   This morning felt throat was irritated.  couldn't sleep good or get into bed last night due to neck pain, can't bring chin to chest.   No arm pain, weakness,numbness, tingling.   No low back pain.  She was cleaning the doors in her house this past weekend, denies sudden onset pain with activity though.  No other aggravating or relieving factors.  Past Medical History  Diagnosis Date  . Allergy   . Tobacco use disorder     quit 06/2012  . TMJ (dislocation of temporomandibular joint)   . GERD (gastroesophageal reflux disease)   . Routine gynecological examination 05/2012    Dr. Lorenso Courier  . History of mammogram 5/13    normal  . Abnormal finding on Pap smear 05/2012    Dr. Lorenso Courier  . Eczema     hands  . Rosacea     Dr. Terri Piedra   ROS as in subjective  Objective: Filed Vitals:   01/28/13 1618  BP: 118/80  Pulse: 76  Temp: 98.1 F (36.7 C)  Resp: 16    General appearance: alert, no distress, WD/WN Neck: supple, tender bilat neck, ROM about 70% of normal, no lymphadenopathy, no thyromegaly, no masses Back: tender bilat upper back paraspinal, +spasm, otherwise back nontender, normal ROM, no deformity MSK: bilat UE nontneder, normal ROM Pulses normal  Neuro: UE normal strength, sensation, DTRs.   Assessment: Encounter Diagnoses  Name Primary?  . Back strain, initial encounter Yes  . Back muscle spasm      Plan: Advised of diagnosis, possible activities that contributed.  Begin flexeril, heat, stretching and ROM activity after few days of rest, can use 2 Aleve BID OTC the next several days.  She has Ultram at home prn for worse pain.   Should resolve in 5-7 days.  Follow-up if worse or not improving.

## 2013-12-28 ENCOUNTER — Ambulatory Visit (INDEPENDENT_AMBULATORY_CARE_PROVIDER_SITE_OTHER): Payer: BC Managed Care – PPO | Admitting: Medical

## 2013-12-28 ENCOUNTER — Encounter: Payer: Self-pay | Admitting: Medical

## 2013-12-28 VITALS — BP 140/90 | HR 68 | Temp 98.0°F | Resp 14 | Wt 234.0 lb

## 2013-12-28 DIAGNOSIS — D582 Other hemoglobinopathies: Secondary | ICD-10-CM

## 2013-12-28 DIAGNOSIS — R5383 Other fatigue: Secondary | ICD-10-CM

## 2013-12-28 DIAGNOSIS — R030 Elevated blood-pressure reading, without diagnosis of hypertension: Secondary | ICD-10-CM

## 2013-12-28 DIAGNOSIS — R5382 Chronic fatigue, unspecified: Secondary | ICD-10-CM

## 2013-12-28 DIAGNOSIS — M7989 Other specified soft tissue disorders: Secondary | ICD-10-CM

## 2013-12-28 DIAGNOSIS — R5381 Other malaise: Secondary | ICD-10-CM

## 2013-12-28 DIAGNOSIS — F17200 Nicotine dependence, unspecified, uncomplicated: Secondary | ICD-10-CM

## 2013-12-28 LAB — BASIC METABOLIC PANEL
BUN: 11 mg/dL (ref 6–23)
CO2: 27 mEq/L (ref 19–32)
Calcium: 9.7 mg/dL (ref 8.4–10.5)
Chloride: 105 mEq/L (ref 96–112)
Creat: 0.74 mg/dL (ref 0.50–1.10)
Glucose, Bld: 90 mg/dL (ref 70–99)
Potassium: 5.1 mEq/L (ref 3.5–5.3)
Sodium: 140 mEq/L (ref 135–145)

## 2013-12-28 MED ORDER — VARENICLINE TARTRATE 0.5 MG X 11 & 1 MG X 42 PO MISC: ORAL | Status: DC

## 2013-12-28 NOTE — Progress Notes (Signed)
    Subjective: Patient is a 43 y.o. female presenting for check thyroid, cbc and smoking cessation.  She recent had routine annual gyn visit on Monday, Hemoglobin was elevated. Was told that they would fax results to our practice. Was also advised to have thyroid check. Has felt tired, a bit fatigued at night, occasional palpations, some nausea and occasional loose stool, swelling in her hands, occasional tingling in hands. Admits stress at work since November and is actually starting a new job Monday. Denies having dry skin, coarse hair, heart racing, chest pain, abdominal pain, edema.   Smoking cessation - tried Chantix before, two years ago.   Had quit for about a year.  She actually quit smoking for a year but relapsed with all the stress she's going through at work. She admits that she had vivid dreams while on Chantix but she otherwise tolerated it very well. Wants to restart Chantix to help her quit smoking again.  Currently smoking 1ppd.   No recent diet changes, no recent lifestyle changes other than healthier low salt diet as husband was told to cahnge to low salt given his health.    Review of Systems Constitutional: -fever, -chills, -sweats, -unexpected weight change,+fatigue ENT: -runny nose, -ear pain, -sore throat Cardiology:  -chest pain, +palpitations, +hand edema she attributes to eczema Respiratory: -cough, -shortness of breath, -wheezing Gastroenterology: -abdominal pain, -nausea, -vomiting, -diarrhea, -constipation  Hematology: -bleeding or bruising problems Musculoskeletal: -arthralgias, -myalgias, -joint swelling, -back pain Ophthalmology: -vision changes Urology: -dysuria, -difficulty urinating, -hematuria, -urinary frequency, -urgency Neurology: -headache, -weakness, +tingling, -numbness     Objective:  BP 140/90  Pulse 68  Temp(Src) 98 F (36.7 C) (Oral)  Resp 14  Wt 234 lb (106.142 kg)  BP Readings from Last 3 Encounters:  12/28/13 140/90  01/28/13  118/80  08/02/12 140/80   Wt Readings from Last 3 Encounters:  12/28/13 234 lb (106.142 kg)  01/28/13 248 lb (112.492 kg)  08/02/12 243 lb (110.224 kg)   General appearance: alert, no distress, WD/WN HEENT: normocephalic, sclerae anicteric, TMs pearly, nares patent, no discharge or erythema, pharynx normal Oral cavity: MMM, no lesions Neck: supple, no lymphadenopathy, no thyromegaly, no masses Heart: RRR, normal S1, S2, no murmurs Lungs: CTA bilaterally, wheezing in right upper lobe, rhonchi, or rales Abdomen: +bs, soft, non tender, non distended, no masses, no hepatomegaly Pulses: 2+ symmetric, upper and lower extremities, normal cap refill Ext: no obvious edema, no cyanosis or clubbing  Assessment: Encounter Diagnoses  Name Primary?  . Chronic fatigue Yes  . Abnormal hemoglobin   . Swelling of limb   . Tobacco use disorder   . Elevated blood pressure reading without diagnosis of hypertension      Plan: Discussed her concerns.  No major exam findings today.  Labs today.  Begin back on Chantix, discussed risks/benefits of chantix, efforts to stop tobacco, advised 1-800 QUIT NOW hotline for counseling.  Recheck 74mo.  We discussed possible causes of elevated Hgb.  Hers is mildly elevated, she is a smoker.  We will c/t to monitor this and BP.    F/u pending labs, 74mo.

## 2013-12-29 LAB — TSH: TSH: 0.86 u[IU]/mL (ref 0.350–4.500)

## 2013-12-29 LAB — VITAMIN D 25 HYDROXY (VIT D DEFICIENCY, FRACTURES): Vit D, 25-Hydroxy: 31 ng/mL (ref 30–89)

## 2013-12-29 LAB — T4, FREE: Free T4: 1.32 ng/dL (ref 0.80–1.80)

## 2014-01-02 ENCOUNTER — Telehealth: Payer: Self-pay | Admitting: Medical

## 2014-01-02 NOTE — Telephone Encounter (Signed)
Initiated P.A. For Chantix starting month box

## 2014-01-03 ENCOUNTER — Encounter: Payer: Self-pay | Admitting: Medical

## 2014-01-04 NOTE — Telephone Encounter (Signed)
P.A. Approved Chantix, left message for pt, faxed pharmacy

## 2014-02-13 ENCOUNTER — Other Ambulatory Visit: Payer: Self-pay | Admitting: Medical

## 2014-02-13 ENCOUNTER — Telehealth: Payer: Self-pay | Admitting: Medical

## 2014-02-13 MED ORDER — VARENICLINE TARTRATE 1 MG PO TABS: 1.0000 mg | ORAL_TABLET | Freq: Two times a day (BID) | ORAL | Status: DC

## 2014-02-13 NOTE — Telephone Encounter (Signed)
Med sent, f/u 14mo

## 2014-03-31 ENCOUNTER — Telehealth: Payer: Self-pay | Admitting: Internal Medicine

## 2014-03-31 NOTE — Telephone Encounter (Signed)
Pt needs a refill on her chantix.  She has been doing great on it so far. Send to [address]. Pt knows it will be Monday before she gets a refill on it

## 2014-04-02 ENCOUNTER — Other Ambulatory Visit: Payer: Self-pay | Admitting: Medical

## 2014-04-02 MED ORDER — VARENICLINE TARTRATE 1 MG PO TABS: 1.0000 mg | ORAL_TABLET | Freq: Two times a day (BID) | ORAL | Status: DC

## 2014-04-02 NOTE — Telephone Encounter (Signed)
Call out continuing month of chantix.

## 2014-04-03 NOTE — Telephone Encounter (Signed)
I called out her refill to her pharmacy per [facility] PAC. CLS

## 2015-08-13 ENCOUNTER — Encounter: Payer: Self-pay | Admitting: Family Medicine

## 2015-08-13 ENCOUNTER — Ambulatory Visit (INDEPENDENT_AMBULATORY_CARE_PROVIDER_SITE_OTHER): Payer: BLUE CROSS/BLUE SHIELD | Admitting: Family Medicine

## 2015-08-13 VITALS — BP 130/88 | HR 58 | Wt 243.0 lb

## 2015-08-13 DIAGNOSIS — Z720 Tobacco use: Secondary | ICD-10-CM | POA: Diagnosis not present

## 2015-08-13 DIAGNOSIS — Z716 Tobacco abuse counseling: Secondary | ICD-10-CM

## 2015-08-13 DIAGNOSIS — E669 Obesity, unspecified: Secondary | ICD-10-CM

## 2015-08-13 DIAGNOSIS — J0100 Acute maxillary sinusitis, unspecified: Secondary | ICD-10-CM | POA: Diagnosis not present

## 2015-08-13 DIAGNOSIS — Z87891 Personal history of nicotine dependence: Secondary | ICD-10-CM

## 2015-08-13 MED ORDER — DOXYCYCLINE HYCLATE 100 MG PO TABS: 100.0000 mg | ORAL_TABLET | Freq: Two times a day (BID) | ORAL | Status: DC

## 2015-08-13 NOTE — Progress Notes (Signed)
   Subjective:    Patient ID: Dana Wilkins, female    DOB: 07-01-1971, 45 y.o.   MRN: [id number]  HPI She complains of a one-month history that started with dizziness and malaise followed by PND, nasal congestion, rhinorrhea as well as ear congestion. Had some slight chills and a dry cough. She now also complains of left maxillary sinus and upper tooth discomfort. Changes intermittently purulent. She did quit smoking one week ago. She is using small amounts of candy instead of smoking. She also is starting to walk on her breaks to help with her smoking and with weight reduction.   Review of Systems     Objective:   Physical Exam Alert and in no distress. Tympanic membranes and canals are normal. Pharyngeal area is normal. Nasal mucosa is slightly reddish with tenderness especially over left maxillary sinusNeck is supple without adenopathy or thyromegaly. Cardiac exam shows a regular sinus rhythm without murmurs or gallops. Lungs are clear to auscultation.        Assessment & Plan:  Former smoker  Encounter for smoking cessation counseling  Acute maxillary sinusitis, recurrence not specified - Plan: doxycycline (VIBRA-TABS) 100 MG tablet  Obesity (BMI 30-39.9) Treat her with doxycycline. She is to call me if she is not entirely better. Discussed her smoking cessation. Encouraged her to continue with her present regimen but to use a noncaloric item to help with smoking cessation. Discussed continuing her walking program. Also discussed issues that might make her smoke again especially stress and stress management as well as potentially having just one cigarette and strongly encouraged her to avoid these.

## 2015-08-13 NOTE — Patient Instructions (Addendum)
Keep up your exercise for your morning and afternoon breaks. Keep yourself something else to do and make it noncaloric Call the antibiotic and if not totally back to normal when you finish give me a call

## 2015-08-30 ENCOUNTER — Telehealth: Payer: Self-pay | Admitting: Family Medicine

## 2015-08-30 DIAGNOSIS — J0100 Acute maxillary sinusitis, unspecified: Secondary | ICD-10-CM

## 2015-08-30 NOTE — Telephone Encounter (Signed)
Pt send me a email stating that she needs another round of medication. She states that she is still not 100%.

## 2015-09-01 MED ORDER — DOXYCYCLINE HYCLATE 100 MG PO TABS: 100.0000 mg | ORAL_TABLET | Freq: Two times a day (BID) | ORAL | Status: DC

## 2015-10-16 ENCOUNTER — Ambulatory Visit (INDEPENDENT_AMBULATORY_CARE_PROVIDER_SITE_OTHER): Payer: BLUE CROSS/BLUE SHIELD | Admitting: Family Medicine

## 2015-10-16 ENCOUNTER — Encounter: Payer: Self-pay | Admitting: Family Medicine

## 2015-10-16 VITALS — BP 130/84 | HR 87 | Wt 245.0 lb

## 2015-10-16 DIAGNOSIS — M542 Cervicalgia: Secondary | ICD-10-CM

## 2015-10-16 DIAGNOSIS — L309 Dermatitis, unspecified: Secondary | ICD-10-CM | POA: Diagnosis not present

## 2015-10-16 NOTE — Progress Notes (Signed)
   Subjective:    Patient ID: Francine GravenShelly K Randon, female    DOB: July 18, 1970, 45 y.o.   MRN: [id number]  HPI She complains of a several month history of bilateral ear itching mainly at the ear canal. No drainage. She also complains of a one-day history of sore throat and has noted some neck discomfort especially with motion but no numbness, tingling or weakness.he also complains of some slight left ear preauricular swelling and discomfort.   Review of Systems     Objective:   Physical Exam Alert and in no distress. A 1-1/2 cm round smooth lesion is noted just superior to the pinna. The meatus to the ear canal is slightly erythematous. The canals and TMs are normal. Neck is supple without adenopathy. Throat is clear.       Assessment & Plan:  Dermatitis  Neck pain plan cortisone cream for the dermatitis. Also recommend heat and anti-inflammatory as well as stretching for the neck. She will call if continued difficulty.

## 2015-10-19 DIAGNOSIS — H60333 Swimmer's ear, bilateral: Secondary | ICD-10-CM | POA: Diagnosis not present

## 2015-10-19 DIAGNOSIS — J3089 Other allergic rhinitis: Secondary | ICD-10-CM | POA: Diagnosis not present

## 2015-10-19 DIAGNOSIS — H6123 Impacted cerumen, bilateral: Secondary | ICD-10-CM | POA: Diagnosis not present

## 2015-11-06 DIAGNOSIS — H60333 Swimmer's ear, bilateral: Secondary | ICD-10-CM | POA: Diagnosis not present

## 2015-11-06 DIAGNOSIS — H6123 Impacted cerumen, bilateral: Secondary | ICD-10-CM | POA: Diagnosis not present

## 2016-03-05 DIAGNOSIS — M25511 Pain in right shoulder: Secondary | ICD-10-CM | POA: Diagnosis not present

## 2016-03-17 ENCOUNTER — Encounter: Payer: Self-pay | Admitting: Family Medicine

## 2016-03-17 ENCOUNTER — Ambulatory Visit (INDEPENDENT_AMBULATORY_CARE_PROVIDER_SITE_OTHER): Payer: BLUE CROSS/BLUE SHIELD | Admitting: Family Medicine

## 2016-03-17 VITALS — BP 154/94 | HR 72 | Temp 98.2°F | Wt 242.0 lb

## 2016-03-17 DIAGNOSIS — J0110 Acute frontal sinusitis, unspecified: Secondary | ICD-10-CM | POA: Diagnosis not present

## 2016-03-17 MED ORDER — CLARITHROMYCIN 500 MG PO TABS: 250.0000 mg | ORAL_TABLET | Freq: Two times a day (BID) | ORAL | 0 refills | Status: DC

## 2016-03-17 NOTE — Patient Instructions (Signed)

## 2016-03-17 NOTE — Progress Notes (Signed)
Subjective:  Dana GravenShelly K Rozenberg is a 45 y.o. female who presents for possible sinus infection.  Symptoms include headache, sinus pressure, thick green mucous, and mild throat irritation.  Denies fever, chills, cough, chest pain, palpitations, shortness of breath, abdominal pain, nausea, vomiting.  Ear drainage and is seeing Dr. Ezzard StandingNewman for this.  History of seasonal allergies and takes daily chloratab.  History of TMJ and dental issues.  No recent antibiotic use.   Past history is significant for no history of pneumonia or bronchitis. Patient is a smoker  (<1 ppd x 20 yrs).  Using and mucinex and iburprofen for symptoms.  Denies sick contacts.  No other aggravating or relieving factors.  No other c/o.  ROS as in subjective   Objective: Vitals:   03/17/16 1443  BP: (!) 154/94  Pulse: 72  Temp: 98.2 F (36.8 C)    General appearance: Alert, WD/WN, no distress                             Skin: warm, no rash                           Head: + frontal sinus tenderness,                            Eyes: conjunctiva normal, corneas clear, PERRLA                            Ears: pearly TMs, external ear canals normal                          Nose: septum midline, turbinates swollen, with erythema and clear discharge             Mouth/throat: MMM, tongue normal, mild pharyngeal erythema                           Neck: supple, no adenopathy, no thyromegaly, nontender                          Heart: RRR, normal S1, S2, no murmurs                         Lungs: CTA bilaterally, no wheezes, rales, or rhonchi      Assessment and Plan:  Acute frontal sinusitis, recurrence not specified  Prescription given for Biaxin.  Can use OTC Mucinex for congestion.  Tylenol or Ibuprofen OTC for fever and malaise.  Discussed symptomatic relief, nasal saline flush, and call or return if worse or not improving in 2-3 days.   Discussed that her blood pressure is elevated today and she has no history of hypertension.  Recommend that she check this outside of the office and if it continues to be elevated then she should follow-up.

## 2016-03-20 DIAGNOSIS — Z1231 Encounter for screening mammogram for malignant neoplasm of breast: Secondary | ICD-10-CM | POA: Diagnosis not present

## 2016-03-20 DIAGNOSIS — Z6839 Body mass index (BMI) 39.0-39.9, adult: Secondary | ICD-10-CM | POA: Diagnosis not present

## 2016-03-20 DIAGNOSIS — Z01419 Encounter for gynecological examination (general) (routine) without abnormal findings: Secondary | ICD-10-CM | POA: Diagnosis not present

## 2016-04-17 DIAGNOSIS — Z23 Encounter for immunization: Secondary | ICD-10-CM | POA: Diagnosis not present

## 2017-04-06 DIAGNOSIS — Z1231 Encounter for screening mammogram for malignant neoplasm of breast: Secondary | ICD-10-CM | POA: Diagnosis not present

## 2017-04-06 DIAGNOSIS — Z01419 Encounter for gynecological examination (general) (routine) without abnormal findings: Secondary | ICD-10-CM | POA: Diagnosis not present

## 2017-04-06 DIAGNOSIS — Z6839 Body mass index (BMI) 39.0-39.9, adult: Secondary | ICD-10-CM | POA: Diagnosis not present

## 2017-04-13 ENCOUNTER — Encounter: Payer: Self-pay | Admitting: Family Medicine

## 2017-04-13 ENCOUNTER — Ambulatory Visit (INDEPENDENT_AMBULATORY_CARE_PROVIDER_SITE_OTHER): Payer: BLUE CROSS/BLUE SHIELD | Admitting: Family Medicine

## 2017-04-13 VITALS — BP 150/94 | HR 66 | Temp 97.9°F | Resp 16 | Ht 65.5 in | Wt 241.0 lb

## 2017-04-13 DIAGNOSIS — J0100 Acute maxillary sinusitis, unspecified: Secondary | ICD-10-CM

## 2017-04-13 DIAGNOSIS — F17200 Nicotine dependence, unspecified, uncomplicated: Secondary | ICD-10-CM

## 2017-04-13 DIAGNOSIS — J301 Allergic rhinitis due to pollen: Secondary | ICD-10-CM | POA: Diagnosis not present

## 2017-04-13 MED ORDER — CLARITHROMYCIN 500 MG PO TABS: 500.0000 mg | ORAL_TABLET | Freq: Two times a day (BID) | ORAL | 0 refills | Status: DC

## 2017-04-13 NOTE — Patient Instructions (Signed)
Take all the medicine and if not totally back to normal, call me

## 2017-04-13 NOTE — Progress Notes (Signed)
   Subjective:    Patient ID: Dana Wilkins, female    DOB: [birth year], 46 y.o.   MRN: [id number]  HPI She states that proximally 5 weeks ago she started having difficulty with rhinorrhea, malaise, itchy watery eyes, sneezing, nasal congestion, PND with ear pressure. Since then the symptoms have continued and she is also had some headache as well as increased dizziness and now purulent postnasal drainage. She does have seasonal allergies mainly in the fall. She has started smoking again.   Review of Systems     Objective:   Physical Exam Alert and in no distress. Nasal mucosa is reddish purple. Tender over frontal and especially maxillary sinuses. Tympanic membranes and canals are normal. Pharyngeal area is normal. Neck is supple without adenopathy or thyromegaly. Cardiac exam shows a regular sinus rhythm without murmurs or gallops. Lungs are clear to auscultation.        Assessment & Plan:  Acute non-recurrent maxillary sinusitis - Plan: clarithromycin (BIAXIN) 500 MG tablet  Current smoker  Seasonal allergic rhinitis due to pollen Presently she is not ready to quit smoking and we will deal with this at a later date. Recommend she treat her allergies like she normally does. Also cautioned her to call me if not totally back to normal when she finishes the antibiotic. Also recommended she set up for complete examination.

## 2017-04-23 ENCOUNTER — Telehealth: Payer: Self-pay | Admitting: Family Medicine

## 2017-04-23 DIAGNOSIS — J0100 Acute maxillary sinusitis, unspecified: Secondary | ICD-10-CM

## 2017-04-23 NOTE — Telephone Encounter (Signed)
Pt called and stated that she was seen for a sinus infection last week. She finished the medication this morning. She states she was told to call back and inform JCL if she was still having issues. She states she is some better but still experiencing symptoms. Pt uses [facility] [address] and can be reached at [phone].

## 2017-04-24 MED ORDER — CLARITHROMYCIN 500 MG PO TABS: 500.0000 mg | ORAL_TABLET | Freq: Two times a day (BID) | ORAL | 0 refills | Status: DC

## 2017-04-24 NOTE — Telephone Encounter (Signed)
I called in another 10 day course. Have her call me if she is not totally back to normal when she finishes the next round of antibiotics

## 2017-04-24 NOTE — Telephone Encounter (Signed)
Called and notified pt of results. 

## 2017-04-27 ENCOUNTER — Telehealth: Payer: Self-pay | Admitting: Family Medicine

## 2017-04-27 MED ORDER — FLUCONAZOLE 150 MG PO TABS: 150.0000 mg | ORAL_TABLET | Freq: Once | ORAL | 0 refills | Status: AC

## 2017-04-27 NOTE — Telephone Encounter (Signed)
Pt called and stated that she now has Thrush. She has had this before when being on a antibiotic. Pt uses [facility] [address] and can be reached at [phone].

## 2017-04-27 NOTE — Telephone Encounter (Signed)
Diflucan called in for antibiotic related thrush

## 2017-04-27 NOTE — Telephone Encounter (Signed)
Let her know that I called the medication in for her 

## 2017-04-27 NOTE — Telephone Encounter (Signed)
Pt was notified of results

## 2017-06-02 DIAGNOSIS — K080 Exfoliation of teeth due to systemic causes: Secondary | ICD-10-CM | POA: Diagnosis not present

## 2017-06-15 ENCOUNTER — Encounter: Payer: BLUE CROSS/BLUE SHIELD | Admitting: Family Medicine

## 2017-08-06 ENCOUNTER — Encounter: Payer: Self-pay | Admitting: Family Medicine

## 2017-08-06 ENCOUNTER — Ambulatory Visit (INDEPENDENT_AMBULATORY_CARE_PROVIDER_SITE_OTHER): Payer: BLUE CROSS/BLUE SHIELD | Admitting: Family Medicine

## 2017-08-06 VITALS — BP 130/90 | HR 76 | Ht 66.5 in | Wt 243.2 lb

## 2017-08-06 DIAGNOSIS — J069 Acute upper respiratory infection, unspecified: Secondary | ICD-10-CM | POA: Diagnosis not present

## 2017-08-06 DIAGNOSIS — J301 Allergic rhinitis due to pollen: Secondary | ICD-10-CM | POA: Diagnosis not present

## 2017-08-06 DIAGNOSIS — Z0000 Encounter for general adult medical examination without abnormal findings: Secondary | ICD-10-CM | POA: Diagnosis not present

## 2017-08-06 DIAGNOSIS — F17200 Nicotine dependence, unspecified, uncomplicated: Secondary | ICD-10-CM | POA: Diagnosis not present

## 2017-08-06 DIAGNOSIS — E669 Obesity, unspecified: Secondary | ICD-10-CM

## 2017-08-06 DIAGNOSIS — Z23 Encounter for immunization: Secondary | ICD-10-CM

## 2017-08-06 LAB — POCT URINALYSIS DIP (PROADVANTAGE DEVICE)
Bilirubin, UA: NEGATIVE
Blood, UA: NEGATIVE
Glucose, UA: NEGATIVE mg/dL
Ketones, POC UA: NEGATIVE mg/dL
Leukocytes, UA: NEGATIVE
Nitrite, UA: NEGATIVE
Protein Ur, POC: NEGATIVE mg/dL
Specific Gravity, Urine: 1.015
Urobilinogen, Ur: NEGATIVE
pH, UA: 6 (ref 5.0–8.0)

## 2017-08-06 NOTE — Addendum Note (Signed)
Addended by: Herminio CommonsJOHNSON, Dai Apel A on: 08/06/2017 04:58 PM   Modules accepted: Orders

## 2017-08-06 NOTE — Progress Notes (Signed)
   Subjective:    Patient ID: Dana Wilkins, female    DOB: 1971/03/20, 47 y.o.   MRN: [id number]  HPI She is here for complete examination.  She has had difficulty over the last several days with malaise, cough, sore throat, headache, PND and some dizziness.  She has been using ibuprofen. She and her husband have been making some dietary changes and she has lost a couple pounds.  She continues to smoke and is not ready to quit at this point.  She has seen her gynecologist regularly.  Her allergies seem to be under good control.  Work and home life seem to be going well.  Family and social history as well as health maintenance and immunizations were reviewed.   Review of Systems  All other systems reviewed and are negative.      Objective:   Physical Exam BP 130/90   Pulse 76   Ht 5' 6.5" (1.689 m)   Wt 243 lb 3.2 oz (110.3 kg)   BMI 38.67 kg/m   General Appearance:    Alert, cooperative, no distress, appears stated age  Head:    Normocephalic, without obvious abnormality, atraumatic  Eyes:    PERRL, conjunctiva/corneas clear, EOM's intact, fundi    benign  Ears:    Normal TM's and external ear canals  Nose:   Nares normal, mucosa normal, no drainage or sinus   tenderness  Throat:   Lips, mucosa, and tongue normal; teeth and gums normal  Neck:   Supple, no lymphadenopathy;  thyroid:  no   enlargement/tenderness/nodules; no carotid   bruit or JVD     Lungs:     Clear to auscultation bilaterally without wheezes, rales or     ronchi; respirations unlabored      Heart:    Regular rate and rhythm, S1 and S2 normal, no murmur, rub   or gallop  Breast Exam:    Deferred to GYN  Abdomen:     Soft, non-tender, nondistended, normoactive bowel sounds,    no masses, no hepatosplenomegaly  Genitalia:    Deferred to GYN     Extremities:   No clubbing, cyanosis or edema  Pulses:   2+ and symmetric all extremities  Skin:   Skin color, texture, turgor normal, no rashes or lesions  Lymph  nodes:   Cervical, supraclavicular, and axillary nodes normal  Neurologic:   CNII-XII intact, normal strength, sensation and gait; reflexes 2+ and symmetric throughout          Psych:   Normal mood, affect, hygiene and grooming.         Assessment & Plan:  Routine general medical examination at a health care facility - Plan: CBC with Differential/Platelet, Comprehensive metabolic panel, Lipid panel  Current smoker  Seasonal allergic rhinitis due to pollen  Obesity (BMI 30-39.9)  Need for Tdap vaccination - Plan: Tdap vaccine greater than or equal to 7yo IM  Acute URI At this time she is not interested in quitting smoking and I do not force the issue there. Recommend supportive care for the URI symptoms. Encouraged her to continue with her diet and exercise regimen.

## 2017-08-07 LAB — CBC WITH DIFFERENTIAL/PLATELET
Basophils Absolute: 0.1 10*3/uL (ref 0.0–0.2)
Basos: 1 %
EOS (ABSOLUTE): 0.1 10*3/uL (ref 0.0–0.4)
Eos: 2 %
Hematocrit: 47.7 % — ABNORMAL HIGH (ref 34.0–46.6)
Hemoglobin: 16.2 g/dL — ABNORMAL HIGH (ref 11.1–15.9)
Immature Grans (Abs): 0 10*3/uL (ref 0.0–0.1)
Immature Granulocytes: 0 %
Lymphocytes Absolute: 1.6 10*3/uL (ref 0.7–3.1)
Lymphs: 30 %
MCH: 29.9 pg (ref 26.6–33.0)
MCHC: 34 g/dL (ref 31.5–35.7)
MCV: 88 fL (ref 79–97)
Monocytes Absolute: 0.7 10*3/uL (ref 0.1–0.9)
Monocytes: 13 %
Neutrophils Absolute: 2.8 10*3/uL (ref 1.4–7.0)
Neutrophils: 54 %
Platelets: 337 10*3/uL (ref 150–379)
RBC: 5.42 x10E6/uL — ABNORMAL HIGH (ref 3.77–5.28)
RDW: 13.4 % (ref 12.3–15.4)
WBC: 5.3 10*3/uL (ref 3.4–10.8)

## 2017-08-07 LAB — COMPREHENSIVE METABOLIC PANEL
ALT: 23 IU/L (ref 0–32)
AST: 17 IU/L (ref 0–40)
Albumin/Globulin Ratio: 2.2 (ref 1.2–2.2)
Albumin: 5 g/dL (ref 3.5–5.5)
Alkaline Phosphatase: 62 IU/L (ref 39–117)
BUN/Creatinine Ratio: 12 (ref 9–23)
BUN: 9 mg/dL (ref 6–24)
Bilirubin Total: 0.3 mg/dL (ref 0.0–1.2)
CO2: 24 mmol/L (ref 20–29)
Calcium: 9.6 mg/dL (ref 8.7–10.2)
Chloride: 103 mmol/L (ref 96–106)
Creatinine, Ser: 0.75 mg/dL (ref 0.57–1.00)
GFR calc Af Amer: 111 mL/min/{1.73_m2} (ref >59)
GFR calc non Af Amer: 96 mL/min/{1.73_m2} (ref >59)
Globulin, Total: 2.3 g/dL (ref 1.5–4.5)
Glucose: 87 mg/dL (ref 65–99)
Potassium: 5.1 mmol/L (ref 3.5–5.2)
Sodium: 141 mmol/L (ref 134–144)
Total Protein: 7.3 g/dL (ref 6.0–8.5)

## 2017-08-07 LAB — LIPID PANEL
Chol/HDL Ratio: 5.5 ratio — ABNORMAL HIGH (ref 0.0–4.4)
Cholesterol, Total: 193 mg/dL (ref 100–199)
HDL: 35 mg/dL — ABNORMAL LOW (ref >39)
LDL Calculated: 129 mg/dL — ABNORMAL HIGH (ref 0–99)
Triglycerides: 145 mg/dL (ref 0–149)
VLDL Cholesterol Cal: 29 mg/dL (ref 5–40)

## 2017-09-18 ENCOUNTER — Ambulatory Visit: Payer: BLUE CROSS/BLUE SHIELD | Admitting: Family Medicine

## 2017-09-18 ENCOUNTER — Encounter: Payer: Self-pay | Admitting: Family Medicine

## 2017-09-18 VITALS — BP 136/82 | HR 67 | Temp 97.9°F | Wt 244.6 lb

## 2017-09-18 DIAGNOSIS — H60333 Swimmer's ear, bilateral: Secondary | ICD-10-CM | POA: Diagnosis not present

## 2017-09-18 MED ORDER — CIPROFLOXACIN HCL 500 MG PO TABS
500.0000 mg | ORAL_TABLET | Freq: Two times a day (BID) | ORAL | 0 refills | Status: DC
Start: 2017-09-18 — End: 2018-02-10

## 2017-09-18 MED ORDER — NEOMYCIN-POLYMYXIN-HC 3.5-10000-1 OT SUSP: 2.0000 [drp] | Freq: Four times a day (QID) | OTIC | 0 refills | Status: DC

## 2017-09-18 NOTE — Progress Notes (Signed)
   Subjective:    Patient ID: Francine GravenShelly K Domingos, female    DOB: 1970-10-10, 47 y.o.   MRN: [id number]  HPI She complains of a 6-week history of difficulty with drainage from both ears.  She has a previous history of difficulty with this.  She does not swim.  Nothing else is changed in her life.   Review of Systems     Objective:   Physical Exam Alert and in no distress.  Slight erythema and crusting is noted at the meatus of both canals.  The canals do seem to be slightly swollen, left greater than right.  No preauricular nodes noted.  TMs appear clear.  Neck is supple without adenopathy.       Assessment & Plan:  Acute swimmer's ear of both sides - Plan: neomycin-polymyxin-hydrocortisone (CORTISPORIN) 3.5-10000-1 OTIC suspension, ciprofloxacin (CIPRO) 500 MG tablet I will treat her aggressively with both topical and systemic therapy.  She will call if continued difficulty

## 2018-01-29 ENCOUNTER — Encounter: Payer: Self-pay | Admitting: Family Medicine

## 2018-01-29 ENCOUNTER — Ambulatory Visit: Payer: BLUE CROSS/BLUE SHIELD | Admitting: Family Medicine

## 2018-01-29 VITALS — BP 138/78 | HR 71 | Temp 97.8°F | Ht 66.0 in | Wt 244.6 lb

## 2018-01-29 DIAGNOSIS — L309 Dermatitis, unspecified: Secondary | ICD-10-CM

## 2018-01-29 MED ORDER — CLOBETASOL PROPIONATE 0.05 % EX CREA: 1.0000 "application " | TOPICAL_CREAM | Freq: Two times a day (BID) | CUTANEOUS | 0 refills | Status: DC

## 2018-01-29 NOTE — Progress Notes (Signed)
   Subjective:    Patient ID: Dana Wilkins, female    DOB: 26-Jun-1971, 47 y.o.   MRN: [id number]  HPI She has a history of eczema and is now having more difficulty with this.  It usually affects her hands and feet.  She has used clobetasol in the past for this.  She also is concerned about rosacea.   Review of Systems     Objective:   Physical Exam Alert and in no distress.  Slightly erythematous medial aspect of the palms is noted with some papular type lesions noted.  This is occurring on the balls of the feet as well.  Exam of the face does not show any lesions.       Assessment & Plan:  Eczema, unspecified type - Plan: clobetasol cream (TEMOVATE) 0.05 % Cautioned her on use of this long-term and the potential damages from that.  Recommend she follow-up with her dermatologist concerning this and possible rosacea.

## 2018-01-30 ENCOUNTER — Telehealth: Payer: Self-pay | Admitting: Family Medicine

## 2018-01-30 NOTE — Telephone Encounter (Signed)
P.A. CLOBETASOL CREAM

## 2018-02-08 NOTE — Telephone Encounter (Signed)
P.A. Approved til 01/29/19

## 2018-02-10 ENCOUNTER — Ambulatory Visit: Payer: BLUE CROSS/BLUE SHIELD | Admitting: Family Medicine

## 2018-02-10 ENCOUNTER — Encounter: Payer: Self-pay | Admitting: Family Medicine

## 2018-02-10 VITALS — BP 110/70 | HR 69 | Temp 97.9°F | Resp 16 | Wt 246.0 lb

## 2018-02-10 DIAGNOSIS — J0140 Acute pansinusitis, unspecified: Secondary | ICD-10-CM

## 2018-02-10 DIAGNOSIS — F17200 Nicotine dependence, unspecified, uncomplicated: Secondary | ICD-10-CM | POA: Diagnosis not present

## 2018-02-10 MED ORDER — CLARITHROMYCIN 500 MG PO TABS: 500.0000 mg | ORAL_TABLET | Freq: Two times a day (BID) | ORAL | 0 refills | Status: DC

## 2018-02-10 NOTE — Progress Notes (Signed)
Subjective:  Dana Wilkins is a 47 y.o. female who presents for a one week history of right maxillary facial pressure, generalized facial and ear pain when bending over.  She also reports post nasal drainage and scratchy throat.   Denies fever, chills, cough, chest pain, palpitations, shortness of breath, wheezing, abdominal pain, N/V/D, LE edema.   Smoker.  Underlying allergies.   Treatment to date: antihistamines and ibuprofen.  Denies sick contacts.  No other aggravating or relieving factors.  No other c/o.  ROS as in subjective.   Objective: Vitals:   02/10/18 1629  BP: 110/70  Pulse: 69  Resp: 16  Temp: 97.9 F (36.6 C)  SpO2: 98%    General appearance: Alert, WD/WN, no distress, mildly ill appearing                             Skin: warm, no rash                           Head: + frontal and maxillary sinus tenderness                            Eyes: conjunctiva normal, corneas clear, PERRLA                            Ears: pearly TMs, external ear canals normal                          Nose: septum midline, turbinates swollen, with erythema and thick yellowish discharge             Mouth/throat: MMM, tongue normal, mild pharyngeal erythema without edema                           Neck: supple, no adenopathy, no thyromegaly, nontender                          Heart: RRR, normal S1, S2, no murmurs                         Lungs: CTA bilaterally, no wheezes, rales, or rhonchi      Assessment: Acute non-recurrent pansinusitis  Smoker    Plan: Discussed diagnosis and treatment of acute sinusitis.  Clarithromycin prescribed due to penicillin allergy.  She has done well with this in the past.  Suggested symptomatic OTC remedies. Nasal saline spray for congestion.  Tylenol or Ibuprofen OTC for fever and malaise.  Call/return if not back to baseline after completing the antibiotic

## 2018-02-12 NOTE — Telephone Encounter (Signed)
Left message for pt, faxed pharmacy  

## 2018-04-05 ENCOUNTER — Encounter: Payer: Self-pay | Admitting: Medical

## 2018-04-05 ENCOUNTER — Ambulatory Visit: Payer: BLUE CROSS/BLUE SHIELD | Admitting: Medical

## 2018-04-05 VITALS — BP 146/90 | HR 70 | Temp 97.8°F | Resp 16 | Ht 66.0 in | Wt 243.8 lb

## 2018-04-05 DIAGNOSIS — R300 Dysuria: Secondary | ICD-10-CM

## 2018-04-05 DIAGNOSIS — M545 Low back pain, unspecified: Secondary | ICD-10-CM

## 2018-04-05 LAB — POCT URINALYSIS DIP (PROADVANTAGE DEVICE)
Bilirubin, UA: NEGATIVE
Blood, UA: NEGATIVE
Glucose, UA: NEGATIVE mg/dL
Ketones, POC UA: NEGATIVE mg/dL
Leukocytes, UA: NEGATIVE
Nitrite, UA: NEGATIVE
Protein Ur, POC: NEGATIVE mg/dL
Specific Gravity, Urine: 1.005
Urobilinogen, Ur: NEGATIVE
pH, UA: 6 (ref 5.0–8.0)

## 2018-04-05 MED ORDER — NITROFURANTOIN MONOHYD MACRO 100 MG PO CAPS: 100.0000 mg | ORAL_CAPSULE | Freq: Two times a day (BID) | ORAL | 0 refills | Status: DC

## 2018-04-05 NOTE — Progress Notes (Signed)
Subjective:   Dana Wilkins is a 47 y.o. female who complains of possible urinary tract infection.  They report symptoms for about 5 days.  Having urinary urgency, has some discomfort in low back, dull pain, some discomfort sometimes with urination.   No blood in urine.   Some urinary odor.   Having some urinary frequency.   No fever.  Has some nausea, no vomiting.  Appetite has been down.   Last UTI was over a year ago.   No prior hospitalization for urinary infection.   No vaginal discharge.   Using hydration and cranberry juice for current symptoms.  Has IUD in place, LMP over a year ago.   IUD due to be removed 05/2018 by gynecology.  No other aggravating or relieving factors.  No other c/o.  Past Medical History:  Diagnosis Date  . Abnormal finding on Pap smear 05/2012   Dr. Lorenso Courier  . Allergy   . Eczema    hands  . GERD (gastroesophageal reflux disease)   . History of mammogram 5/13   normal  . Rosacea    Dr. Terri Piedra  . Routine gynecological examination 05/2012   Dr. Lorenso Courier  . TMJ (dislocation of temporomandibular joint)   . Tobacco use disorder    quit 06/2012    Current Outpatient Medications on File Prior to Visit  Medication Sig Dispense Refill  . ibuprofen (ADVIL,MOTRIN) 200 MG tablet Take 200 mg by mouth every 6 (six) hours as needed.    . pantoprazole (PROTONIX) 40 MG tablet Take 40 mg by mouth daily.    . clobetasol cream (TEMOVATE) 0.05 % Apply 1 application topically 2 (two) times daily. (Patient not taking: Reported on 04/05/2018) 30 g 0   No current facility-administered medications on file prior to visit.     ROS as in subjective  Reviewed allergies, medications, past medical, surgical, and social history.     Objective: Vitals:   04/05/18 1507  BP: (!) 146/90  Pulse: 70  Resp: 16  Temp: 97.8 F (36.6 C)  SpO2: 97%    General appearance: alert, no distress, WD/WN, female Abdomen: +bs, soft, mild generalized tenderness, otherwise non distended, no  masses, no hepatomegaly, no splenomegaly, no bruits Back: no CVA tenderness, but mild mid to upper right paraspinal tenderness GU: deferred      Assessment: Encounter Diagnoses  Name Primary?  . Acute low back pain without sciatica, unspecified back pain laterality Yes  . Dysuria      Plan: Etiology possibly UTI but urinalysis normal.    Discussed hydration, can use Tylenol or Ibuprofen for pain, relative rest, stretching.   Will send urine for culture.   If worse symptoms such as blood in urine, fever, worse nausea, then begin Macrobid.  Otherwise await culture.    Dana Wilkins was seen today for back pain.  Diagnoses and all orders for this visit:  Acute low back pain without sciatica, unspecified back pain laterality -     POCT Urinalysis DIP (Proadvantage Device) -     Urine Culture  Dysuria -     Urine Culture  Other orders -     nitrofurantoin, macrocrystal-monohydrate, (MACROBID) 100 MG capsule; Take 1 capsule (100 mg total) by mouth 2 (two) times daily.

## 2018-04-07 LAB — URINE CULTURE

## 2018-04-09 NOTE — Progress Notes (Signed)
Pt called and informed.

## 2018-05-12 ENCOUNTER — Encounter: Payer: Self-pay | Admitting: Family Medicine

## 2018-05-12 ENCOUNTER — Ambulatory Visit: Payer: BLUE CROSS/BLUE SHIELD | Admitting: Family Medicine

## 2018-05-12 VITALS — BP 130/86 | HR 69 | Temp 98.3°F | Wt 248.8 lb

## 2018-05-12 DIAGNOSIS — J0140 Acute pansinusitis, unspecified: Secondary | ICD-10-CM | POA: Diagnosis not present

## 2018-05-12 MED ORDER — CLARITHROMYCIN 500 MG PO TABS: 500.0000 mg | ORAL_TABLET | Freq: Two times a day (BID) | ORAL | 0 refills | Status: DC

## 2018-05-12 NOTE — Patient Instructions (Signed)
Call if not totally back to normal when you finish the antibiotic 

## 2018-05-12 NOTE — Progress Notes (Signed)
   Subjective:    Patient ID: Dana Wilkins, female    DOB: 09/03/1970, 47 y.o.   MRN: [id number]  HPI She complains of a one-week history of difficulty with sinus pressure and pain, nasal congestion, postnasal drainage with upper tooth discomfort.  No sore throat, sneezing, itchy watery eyes, earache, fever or chills.  She smokes and is contemplating quitting.   Review of Systems     Objective:   Physical Exam Alert and in no distress.  Nasal mucosa is red with tenderness especially her maxillary sinuses tympanic membranes and canals are normal. Pharyngeal area is normal. Neck is supple without adenopathy or thyromegaly. Cardiac exam shows a regular sinus rhythm without murmurs or gallops. Lungs are clear to auscultation.       Assessment & Plan:  Acute non-recurrent pansinusitis - Plan: clarithromycin (BIAXIN) 500 MG tablet She will call if not entirely better when she finishes the antibiotic.

## 2018-05-14 DIAGNOSIS — Z1231 Encounter for screening mammogram for malignant neoplasm of breast: Secondary | ICD-10-CM | POA: Diagnosis not present

## 2018-05-14 DIAGNOSIS — Z6841 Body Mass Index (BMI) 40.0 and over, adult: Secondary | ICD-10-CM | POA: Diagnosis not present

## 2018-05-14 DIAGNOSIS — Z01419 Encounter for gynecological examination (general) (routine) without abnormal findings: Secondary | ICD-10-CM | POA: Diagnosis not present

## 2018-05-24 ENCOUNTER — Telehealth: Payer: Self-pay | Admitting: Family Medicine

## 2018-05-24 DIAGNOSIS — J0140 Acute pansinusitis, unspecified: Secondary | ICD-10-CM

## 2018-05-24 MED ORDER — CLARITHROMYCIN 500 MG PO TABS: 500.0000 mg | ORAL_TABLET | Freq: Two times a day (BID) | ORAL | 0 refills | Status: DC

## 2018-05-24 NOTE — Telephone Encounter (Signed)
Pt called and states she still does not feel good she is still having sinus pressure and drainage, she is wondering if she can get another round of antibiotic , [facility] uses [id number] - [place], [place] - [address]

## 2018-05-24 NOTE — Telephone Encounter (Signed)
I called it in 

## 2018-05-25 NOTE — Telephone Encounter (Signed)
Pt is advised . KH 

## 2018-05-26 DIAGNOSIS — Z3202 Encounter for pregnancy test, result negative: Secondary | ICD-10-CM | POA: Diagnosis not present

## 2018-05-26 DIAGNOSIS — Z30433 Encounter for removal and reinsertion of intrauterine contraceptive device: Secondary | ICD-10-CM | POA: Diagnosis not present

## 2018-06-01 DIAGNOSIS — J0181 Other acute recurrent sinusitis: Secondary | ICD-10-CM | POA: Diagnosis not present

## 2018-06-01 DIAGNOSIS — J343 Hypertrophy of nasal turbinates: Secondary | ICD-10-CM | POA: Diagnosis not present

## 2018-06-01 DIAGNOSIS — J342 Deviated nasal septum: Secondary | ICD-10-CM | POA: Diagnosis not present

## 2018-07-13 DIAGNOSIS — J342 Deviated nasal septum: Secondary | ICD-10-CM | POA: Diagnosis not present

## 2018-07-13 DIAGNOSIS — J343 Hypertrophy of nasal turbinates: Secondary | ICD-10-CM | POA: Diagnosis not present

## 2018-07-13 DIAGNOSIS — J329 Chronic sinusitis, unspecified: Secondary | ICD-10-CM | POA: Diagnosis not present

## 2018-07-13 DIAGNOSIS — J328 Other chronic sinusitis: Secondary | ICD-10-CM | POA: Diagnosis not present

## 2018-08-09 ENCOUNTER — Ambulatory Visit (INDEPENDENT_AMBULATORY_CARE_PROVIDER_SITE_OTHER): Payer: BLUE CROSS/BLUE SHIELD | Admitting: Family Medicine

## 2018-08-09 ENCOUNTER — Encounter: Payer: Self-pay | Admitting: Family Medicine

## 2018-08-09 VITALS — BP 162/96 | HR 86 | Temp 98.1°F | Ht 65.0 in | Wt 240.4 lb

## 2018-08-09 DIAGNOSIS — E785 Hyperlipidemia, unspecified: Secondary | ICD-10-CM | POA: Diagnosis not present

## 2018-08-09 DIAGNOSIS — J301 Allergic rhinitis due to pollen: Secondary | ICD-10-CM | POA: Diagnosis not present

## 2018-08-09 DIAGNOSIS — R5383 Other fatigue: Secondary | ICD-10-CM

## 2018-08-09 DIAGNOSIS — E669 Obesity, unspecified: Secondary | ICD-10-CM | POA: Diagnosis not present

## 2018-08-09 DIAGNOSIS — J342 Deviated nasal septum: Secondary | ICD-10-CM | POA: Diagnosis not present

## 2018-08-09 DIAGNOSIS — Z0000 Encounter for general adult medical examination without abnormal findings: Secondary | ICD-10-CM

## 2018-08-09 DIAGNOSIS — F17200 Nicotine dependence, unspecified, uncomplicated: Secondary | ICD-10-CM | POA: Diagnosis not present

## 2018-08-09 DIAGNOSIS — L309 Dermatitis, unspecified: Secondary | ICD-10-CM

## 2018-08-09 DIAGNOSIS — K219 Gastro-esophageal reflux disease without esophagitis: Secondary | ICD-10-CM

## 2018-08-09 LAB — POCT URINALYSIS DIP (PROADVANTAGE DEVICE)
Bilirubin, UA: NEGATIVE
Blood, UA: NEGATIVE
Glucose, UA: NEGATIVE mg/dL
Ketones, POC UA: NEGATIVE mg/dL
Leukocytes, UA: NEGATIVE
Nitrite, UA: NEGATIVE
Protein Ur, POC: NEGATIVE mg/dL
Specific Gravity, Urine: 1.005
Urobilinogen, Ur: 3.5
pH, UA: 6 (ref 5.0–8.0)

## 2018-08-09 MED ORDER — BUPROPION HCL ER (SR) 150 MG PO TB12: 150.0000 mg | ORAL_TABLET | Freq: Two times a day (BID) | ORAL | 1 refills | Status: DC

## 2018-08-09 NOTE — Addendum Note (Signed)
Addended by: Renelda Loma on: 08/09/2018 02:30 PM   Modules accepted: Orders

## 2018-08-09 NOTE — Patient Instructions (Addendum)
Call 800 quit now.  Also check out the [place]  continue to limit where you are smoking Give yourself positive goals to reach Look at when you smoke and come up with things to do instead of smoking Take the bupropion daily for 3 or 4 days then go to twice per day

## 2018-08-09 NOTE — Progress Notes (Signed)
Subjective:    Patient ID: Dana Wilkins, female    DOB: [birth year], 48 y.o.   MRN: [id number]  HPI She is here for complete examination.  She is interested in quitting smoking but does not want to try Chantix.  Apparently she had bad experience with this in the past.  She does have underlying allergies that seem to be under good control.  She does have reflux disease and is treating this on an as-needed basis.  She has noted recently that she has had more difficulty with greasy foods but has had a cholecystectomy.  She does have a deviated septum and is planning on having surgery.  She also has a history of hyperlipidemia.  She has had difficulty with exercising complaining of hip pain.  Family and social history as well as health maintenance and immunizations was reviewed   Review of Systems  All other systems reviewed and are negative.      Objective:   Physical Exam BP (!) 162/96 (BP Location: Left Arm, Patient Position: Sitting)   Pulse 86   Temp 98.1 F (36.7 C)   Ht 5\' 5"  (1.651 m)   Wt 240 lb 6.4 oz (109 kg)   SpO2 97%   BMI 40.00 kg/m   General Appearance:    Alert, cooperative, no distress, appears stated age  Head:    Normocephalic, without obvious abnormality, atraumatic  Eyes:    PERRL, conjunctiva/corneas clear, EOM's intact, fundi    benign  Ears:    Normal TM's and external ear canals  Nose:   Nares normal, mucosa normal, no drainage or sinus   tenderness  Throat:   Lips, mucosa, and tongue normal; teeth and gums normal  Neck:   Supple, no lymphadenopathy;  thyroid:  no   enlargement/tenderness/nodules; no carotid   bruit or JVD  Back:    Spine nontender, no curvature, ROM normal, no CVA     tenderness  Lungs:     Clear to auscultation bilaterally without wheezes, rales or     ronchi; respirations unlabored  Chest Wall:    No tenderness or deformity   Heart:    Regular rate and rhythm, S1 and S2 normal, no murmur, rub   or gallop  Breast Exam:    Deferred to  GYN  Abdomen:     Soft, non-tender, nondistended, normoactive bowel sounds,    no masses, no hepatosplenomegaly  Genitalia:    Deferred to GYN     Extremities:   No clubbing, cyanosis or edema.  Full motion of her hip without pain.  Pulses:   2+ and symmetric all extremities  Skin:   Skin color, texture, turgor normal, no rashes or lesions  Lymph nodes:   Cervical, supraclavicular, and axillary nodes normal  Neurologic:   CNII-XII intact, normal strength, sensation and gait; reflexes 2+ and symmetric throughout          Psych:   Normal mood, affect, hygiene and grooming.          Assessment & Plan:  Routine general medical examination at a health care facility - Plan: CBC with Differential/Platelet, Comprehensive metabolic panel, Lipid panel, TSH  Smoker - Plan: buPROPion (WELLBUTRIN SR) 150 MG 12 hr tablet  Eczema, unspecified type  Seasonal allergic rhinitis due to pollen  Obesity (BMI 30-39.9) - Plan: CBC with Differential/Platelet, Comprehensive metabolic panel, Lipid panel  Gastroesophageal reflux disease without esophagitis  Hyperlipidemia, unspecified hyperlipidemia type - Plan: Lipid panel  Fatigue, unspecified type - Plan:  CBC with Differential/Platelet, Comprehensive metabolic panel, Lipid panel, TSH  Deviated septum Call 800 quit now.  Also check out the [place]  continue to limit where you are smoking Give yourself positive goals to reach Return here in 1 month for recheck on smoking cessation Look at when you smoke and come up with things to do instead of smoking Take the bupropion daily for 3 or 4 days then go to twice per day Also encouraged her to follow-up with ENT concerning the deviated septum. I explained that the fatigue is is much deconditioning because she is not exercising like she should.  Discussed ways to work around the hip and knee discomfort that she is having.  She does have a history of previous knee surgery. She will also continue to  monitor her reflux symptoms.  Encouraged her to use the Protonix more regularly especially since her clearing the throat is probably reflux related.

## 2018-08-10 LAB — COMPREHENSIVE METABOLIC PANEL
ALT: 19 IU/L (ref 0–32)
AST: 15 IU/L (ref 0–40)
Albumin/Globulin Ratio: 2 (ref 1.2–2.2)
Albumin: 4.5 g/dL (ref 3.8–4.8)
Alkaline Phosphatase: 61 IU/L (ref 39–117)
BUN/Creatinine Ratio: 13 (ref 9–23)
BUN: 10 mg/dL (ref 6–24)
Bilirubin Total: 0.4 mg/dL (ref 0.0–1.2)
CO2: 21 mmol/L (ref 20–29)
Calcium: 9.9 mg/dL (ref 8.7–10.2)
Chloride: 104 mmol/L (ref 96–106)
Creatinine, Ser: 0.78 mg/dL (ref 0.57–1.00)
GFR calc Af Amer: 105 mL/min/{1.73_m2} (ref >59)
GFR calc non Af Amer: 91 mL/min/{1.73_m2} (ref >59)
Globulin, Total: 2.3 g/dL (ref 1.5–4.5)
Glucose: 102 mg/dL — ABNORMAL HIGH (ref 65–99)
Potassium: 4.9 mmol/L (ref 3.5–5.2)
Sodium: 140 mmol/L (ref 134–144)
Total Protein: 6.8 g/dL (ref 6.0–8.5)

## 2018-08-10 LAB — CBC WITH DIFFERENTIAL/PLATELET
Basophils Absolute: 0.1 10*3/uL (ref 0.0–0.2)
Basos: 1 %
EOS (ABSOLUTE): 0.1 10*3/uL (ref 0.0–0.4)
Eos: 1 %
Hematocrit: 47.5 % — ABNORMAL HIGH (ref 34.0–46.6)
Hemoglobin: 15.8 g/dL (ref 11.1–15.9)
Immature Grans (Abs): 0 10*3/uL (ref 0.0–0.1)
Immature Granulocytes: 0 %
Lymphocytes Absolute: 1.7 10*3/uL (ref 0.7–3.1)
Lymphs: 19 %
MCH: 29.4 pg (ref 26.6–33.0)
MCHC: 33.3 g/dL (ref 31.5–35.7)
MCV: 88 fL (ref 79–97)
Monocytes Absolute: 0.7 10*3/uL (ref 0.1–0.9)
Monocytes: 8 %
Neutrophils Absolute: 6.5 10*3/uL (ref 1.4–7.0)
Neutrophils: 71 %
Platelets: 311 10*3/uL (ref 150–450)
RBC: 5.38 x10E6/uL — ABNORMAL HIGH (ref 3.77–5.28)
RDW: 12.8 % (ref 11.7–15.4)
WBC: 9.2 10*3/uL (ref 3.4–10.8)

## 2018-08-10 LAB — LIPID PANEL
Chol/HDL Ratio: 5.2 ratio — ABNORMAL HIGH (ref 0.0–4.4)
Cholesterol, Total: 209 mg/dL — ABNORMAL HIGH (ref 100–199)
HDL: 40 mg/dL (ref >39)
LDL Calculated: 148 mg/dL — ABNORMAL HIGH (ref 0–99)
Triglycerides: 107 mg/dL (ref 0–149)
VLDL Cholesterol Cal: 21 mg/dL (ref 5–40)

## 2018-08-10 LAB — TSH: TSH: 1.37 u[IU]/mL (ref 0.450–4.500)

## 2018-08-23 ENCOUNTER — Encounter: Payer: Self-pay | Admitting: Family Medicine

## 2018-08-23 ENCOUNTER — Ambulatory Visit: Payer: BLUE CROSS/BLUE SHIELD | Admitting: Family Medicine

## 2018-08-23 VITALS — BP 130/90 | HR 67 | Temp 98.5°F | Resp 16 | Ht 66.0 in | Wt 241.2 lb

## 2018-08-23 DIAGNOSIS — H60502 Unspecified acute noninfective otitis externa, left ear: Secondary | ICD-10-CM

## 2018-08-23 MED ORDER — NEOMYCIN-COLIST-HC-THONZONIUM 3.3-3-10-0.5 MG/ML OT SUSP: 3.0000 [drp] | Freq: Three times a day (TID) | OTIC | 0 refills | Status: DC

## 2018-08-23 NOTE — Progress Notes (Signed)
   Subjective:    Patient ID: Dana Wilkins, female    DOB: [birth year], 48 y.o.   MRN: [id number]  HPI She states that several days ago, both ears were draining however on Friday she developed a headache, earache and left ear swelling.  Apparently the drainage has stopped.  She states that it hurts to chew especially on the left.   Review of Systems     Objective:   Physical Exam Alert and in no distress.  Tender to palpation and manipulation of the left ear but none on the right.  The right canal and TM are normal.  Left canal is swollen.  TM difficult to see.       Assessment & Plan:  Acute otitis externa of left ear, unspecified type - Plan: neomycin-colistin-hydrocortisone-thonzonium (CORTISPORIN-TC) 3.09-06-08-0.5 MG/ML OTIC suspension She is also to use anti-inflammatory twice for the pain and call if continued difficulty.  Recommend she eat softer foods to cause less discomfort.

## 2018-09-06 ENCOUNTER — Ambulatory Visit: Payer: BLUE CROSS/BLUE SHIELD | Admitting: Family Medicine

## 2018-09-06 ENCOUNTER — Encounter: Payer: Self-pay | Admitting: Family Medicine

## 2018-09-06 VITALS — BP 156/86 | HR 57 | Temp 98.2°F | Wt 237.0 lb

## 2018-09-06 DIAGNOSIS — I10 Essential (primary) hypertension: Secondary | ICD-10-CM

## 2018-09-06 DIAGNOSIS — F17200 Nicotine dependence, unspecified, uncomplicated: Secondary | ICD-10-CM

## 2018-09-06 MED ORDER — LOSARTAN POTASSIUM-HCTZ 50-12.5 MG PO TABS: 1.0000 | ORAL_TABLET | Freq: Every day | ORAL | 1 refills | Status: DC

## 2018-09-06 NOTE — Progress Notes (Signed)
   Subjective:    Patient ID: Dana Wilkins, female    DOB: Feb 26, 1971, 48 y.o.   MRN: [id number]  HPI She is here for a recheck.  She has been on the DASH diet and has lost a few pounds.  She finds that the Wellbutrin is causing her to be angry although she does admit to being under stress due to having a sister whose husband is apparently having difficulty with cancer.  She is also here because of her blood pressure.   Review of Systems     Objective:   Physical Exam Alert and in no distress otherwise not examined       Assessment & Plan:  Essential hypertension - Plan: losartan-hydrochlorothiazide (HYZAAR) 50-12.5 MG tablet  Smoker Recommend she cut back on her Wellbutrin to once per day.  I will place her on losartan/HCTZ and check her again in a month.  I then discussed the stress that she is under dealing with her sister and the problem she is having.  Strongly encouraged her to be supportive but not take on the burden for her sister as it would not do any good.

## 2018-10-07 ENCOUNTER — Ambulatory Visit: Payer: BLUE CROSS/BLUE SHIELD | Admitting: Family Medicine

## 2018-10-07 ENCOUNTER — Encounter: Payer: Self-pay | Admitting: Family Medicine

## 2018-10-07 ENCOUNTER — Other Ambulatory Visit: Payer: Self-pay

## 2018-10-07 VITALS — BP 139/77 | Wt 227.0 lb

## 2018-10-07 DIAGNOSIS — K219 Gastro-esophageal reflux disease without esophagitis: Secondary | ICD-10-CM

## 2018-10-07 DIAGNOSIS — J301 Allergic rhinitis due to pollen: Secondary | ICD-10-CM

## 2018-10-07 DIAGNOSIS — I10 Essential (primary) hypertension: Secondary | ICD-10-CM

## 2018-10-07 MED ORDER — LOSARTAN POTASSIUM-HCTZ 100-12.5 MG PO TABS: 1.0000 | ORAL_TABLET | Freq: Every day | ORAL | 3 refills | Status: DC

## 2018-10-07 NOTE — Progress Notes (Signed)
Documentation for virtual telephone encounter.  Documentation for virtual audio and video telecommunications through Zoom encounter:   The patient was located at home. The provider was located in the office. The patient did consent to this visit and is aware of possible charges through their insurance for this visit.  The other persons participating in this telemedicine service were none.  This virtual service is not related to other E/M service within previous 7 days.    Subjective:    Patient ID: Francine Graven, female    DOB: 1971/02/02, 48 y.o.   MRN: [id number]  HPI She is here for virtual visit concerning multiple issues.  She has been checking her blood pressure at home has not had the cuff checked against ours.  The most recent readings were in the 158/84 range.  Presently she is on Hyzaar 50/12.5.  She is having no difficulty with that.  She also has underlying allergies and is concerned about what medicines to use.  She presently is using Flonase but is having some postnasal drainage and occasional sneezing. She also has reflux symptoms usually 2 or 3 times per week and does use Tums with good results.  Review of Systems     Objective:   Physical Exam  Alert and in no distress.  Blood pressure is recorded from her machine.      Assessment & Plan:  Essential hypertension - Plan: losartan-hydrochlorothiazide (HYZAAR) 100-12.5 MG tablet  Seasonal allergic rhinitis due to pollen  Gastroesophageal reflux disease without esophagitis  I will increase her Hyzaar to 100/12.5. Also recommend she add Claritin to her regimen as well as using the Flonase to see if that will help with her allergy symptoms. Recommend she use liquid Maalox or Mylanta when she has difficulty to get more instant relief.  Also discussed the need to see if any particular foods play a role in this and also to not eat anything within several hours of bedtime.  She will keep me informed with this.   She is  to return to the office in 1 month and bring her blood pressure cuff with her.

## 2018-10-18 ENCOUNTER — Other Ambulatory Visit: Payer: Self-pay | Admitting: Family Medicine

## 2018-10-18 DIAGNOSIS — I10 Essential (primary) hypertension: Secondary | ICD-10-CM

## 2018-10-18 DIAGNOSIS — F17200 Nicotine dependence, unspecified, uncomplicated: Secondary | ICD-10-CM

## 2018-10-18 MED ORDER — BUPROPION HCL ER (SR) 150 MG PO TB12: 150.0000 mg | ORAL_TABLET | Freq: Two times a day (BID) | ORAL | 1 refills | Status: DC

## 2018-10-18 NOTE — Telephone Encounter (Signed)
[facility] wellburtrin. Please advise [facility]

## 2018-11-08 ENCOUNTER — Ambulatory Visit: Payer: BLUE CROSS/BLUE SHIELD | Admitting: Family Medicine

## 2018-11-08 ENCOUNTER — Encounter: Payer: Self-pay | Admitting: Family Medicine

## 2018-11-08 ENCOUNTER — Other Ambulatory Visit: Payer: Self-pay

## 2018-11-08 VITALS — BP 156/90 | Temp 97.5°F | Wt 229.0 lb

## 2018-11-11 ENCOUNTER — Other Ambulatory Visit: Payer: Self-pay

## 2018-11-11 ENCOUNTER — Encounter: Payer: Self-pay | Admitting: Family Medicine

## 2018-11-11 ENCOUNTER — Ambulatory Visit: Payer: BLUE CROSS/BLUE SHIELD | Admitting: Family Medicine

## 2018-11-11 VITALS — BP 151/86 | HR 78 | Temp 97.8°F | Wt 230.8 lb

## 2018-11-11 DIAGNOSIS — L309 Dermatitis, unspecified: Secondary | ICD-10-CM | POA: Diagnosis not present

## 2018-11-11 DIAGNOSIS — I10 Essential (primary) hypertension: Secondary | ICD-10-CM | POA: Diagnosis not present

## 2018-11-11 MED ORDER — AMLODIPINE BESYLATE 5 MG PO TABS: 5.0000 mg | ORAL_TABLET | Freq: Every day | ORAL | 3 refills | Status: DC

## 2018-11-11 NOTE — Progress Notes (Signed)
   Subjective:    Patient ID: Dana Wilkins, female    DOB: Feb 05, 1971, 48 y.o.   MRN: [id number]  HPI She is here for a med check appointment.  She did bring her blood pressure cuff in with her.  She has been getting fairly decent readings at home but none of them have been consistently below 130/80. She also complains of some drainage especially from the right ear but to a lesser extent from the left ear.  No ear pain, fever, chills. Review of Systems     Objective:   Physical Exam  Alert and in no distress blood pressure is recorded and her blood pressure cuff is accurate. Exam of the external canal does show some erythema and slight drainage at the external canal.      Assessment & Plan:  Essential hypertension - Plan: amLODipine (NORVASC) 5 MG tablet  Eczema, unspecified type I will add amlodipine to her regimen.  Again discussed the need for her make diet and exercise changes. Also recommend use cortisone cream on the eczema that she has in her ears.  She is comfortable with that. She is to call me in 1 month with her blood pressure readings.

## 2018-11-11 NOTE — Patient Instructions (Signed)
Call me in 1 month and give me your blood pressure readings.

## 2019-04-07 ENCOUNTER — Telehealth: Payer: Self-pay | Admitting: Family Medicine

## 2019-04-07 DIAGNOSIS — F17200 Nicotine dependence, unspecified, uncomplicated: Secondary | ICD-10-CM

## 2019-04-07 MED ORDER — BUPROPION HCL ER (SR) 150 MG PO TB12: 150.0000 mg | ORAL_TABLET | Freq: Two times a day (BID) | ORAL | 2 refills | Status: DC

## 2019-04-07 NOTE — Telephone Encounter (Signed)
pts husband called requesting a refill on pts wellbutrin states she needs it sent to the [id number] - [place], [place]

## 2019-04-29 ENCOUNTER — Other Ambulatory Visit: Payer: Self-pay | Admitting: Family Medicine

## 2019-04-29 DIAGNOSIS — F17200 Nicotine dependence, unspecified, uncomplicated: Secondary | ICD-10-CM

## 2019-05-16 ENCOUNTER — Ambulatory Visit: Payer: BC Managed Care – PPO | Admitting: Family Medicine

## 2019-05-16 ENCOUNTER — Other Ambulatory Visit: Payer: Self-pay

## 2019-05-16 ENCOUNTER — Encounter: Payer: Self-pay | Admitting: Family Medicine

## 2019-05-16 VITALS — BP 166/84 | HR 74 | Temp 98.4°F | Wt 226.0 lb

## 2019-05-16 DIAGNOSIS — I10 Essential (primary) hypertension: Secondary | ICD-10-CM | POA: Diagnosis not present

## 2019-05-16 NOTE — Progress Notes (Signed)
   Subjective:    Patient ID: Dana Wilkins, female    DOB: 01/23/1971, 48 y.o.   MRN: [id number]  HPI She is here for a blood pressure recheck.  She did bring in her readings which were on a daily basis.  She also has concerns over intermittent spasm type sensation in her upper chest that lasts less than a second.  She cannot relate this to anything in particular.   Review of Systems     Objective:   Physical Exam Alert and in no distress.  Review of her blood pressure does indicate that she is probably close to 130/80.      Assessment & Plan:  Essential hypertension Recommend she check her blood pressure no more than once per week and then try to average it out.  As long as she is below 130/80 she should be fine.  Also discussed the need for her to exercise on a regular basis as well as making further dietary changes.  Recommend she pay more attention to the spasms in regard to what, when, where and why.

## 2019-05-17 ENCOUNTER — Ambulatory Visit: Payer: Self-pay

## 2019-05-17 ENCOUNTER — Ambulatory Visit (INDEPENDENT_AMBULATORY_CARE_PROVIDER_SITE_OTHER): Payer: BC Managed Care – PPO | Admitting: Orthopaedic Surgery

## 2019-05-17 ENCOUNTER — Encounter: Payer: Self-pay | Admitting: Orthopaedic Surgery

## 2019-05-17 DIAGNOSIS — M7711 Lateral epicondylitis, right elbow: Secondary | ICD-10-CM

## 2019-05-17 DIAGNOSIS — M7062 Trochanteric bursitis, left hip: Secondary | ICD-10-CM

## 2019-05-17 MED ORDER — LIDOCAINE HCL 1 % IJ SOLN
3.0000 mL | INTRAMUSCULAR | Status: AC | PRN
  Administered 2019-05-17: 3 mL

## 2019-05-17 MED ORDER — METHYLPREDNISOLONE ACETATE 40 MG/ML IJ SUSP
40.0000 mg | INTRAMUSCULAR | Status: AC | PRN
  Administered 2019-05-17: 40 mg via INTRA_ARTICULAR

## 2019-05-17 NOTE — Progress Notes (Signed)
Office Visit Note   Patient: Dana Wilkins           Date of Birth: 1970/12/22           MRN: [id number] Visit Date: 05/17/2019              Requested by: Ronnald Nian, MD [address] [place],  [place] [address] PCP: Ronnald Nian, MD   Assessment & Plan: Visit Diagnoses:  1. Trochanteric bursitis of left hip   2. Lateral epicondylitis of right elbow     Plan:  She shown IT band stretching exercises.  Also shown exercises for her right elbow.  Discussed with her formal therapy for the elbow and the hip.  At this point time she will see how she does with the exercises at home.  If her pain persist she will call the office and we can always refer to therapy for one or both of these issues.  She is to pick up Voltaren gel and begin applying this up to 3-4 times daily to the right elbow.  Questions encouraged and answered at length.  Follow-up as needed.  Follow-Up Instructions: Return if symptoms worsen or fail to improve.   Orders:  Orders Placed This Encounter  Procedures  . Large Joint Inj  . XR HIP UNILAT W OR W/O PELVIS 2-3 VIEWS LEFT  . XR Elbow Complete Right (3+View)   No orders of the defined types were placed in this encounter.     Procedures: Large Joint Inj: L greater trochanter on 05/17/2019 8:53 AM Indications: pain Details: 22 G 1.5 in needle, lateral approach  Arthrogram: No  Medications: 3 mL lidocaine 1 %; 40 mg methylPREDNISolone acetate 40 MG/ML Outcome: tolerated well, no immediate complications Procedure, treatment alternatives, risks and benefits explained, specific risks discussed. Consent was given by the patient. Immediately prior to procedure a time out was called to verify the correct patient, procedure, equipment, support staff and site/side marked as required. Patient was prepped and draped in the usual sterile fashion.       Clinical Data: No additional findings.   Subjective: Chief Complaint  Patient presents with  .  Left Hip - Pain  . Right Elbow - Pain    HPI Dana Wilkins is 48 year old female comes in today with left hip and right elbow pain.  She been seen by Dr. Magnus Ivan in the past for her left knee.  She has had no new injury to the hip or elbow.  She states the right elbow pain is been ongoing for the past 2 weeks worse when gripping or lifting objects.  Turning the lid causes her pain.  She has soreness weakness in the elbow.  Feels like she could drop something.  She says it all started after cleaning her windows.  She has had no numbness tingling the arm.  She is taken Advil with some relief.  Left hip pain lateral aspect of the hip ongoing for the past year.  Recently started developing some groin pain.  She states she is unable to walk for a prolonged period of time due to the pain.  No numbness tingling down the leg.  Denies any back pain.  Review of Systems Please see HPI otherwise negative or noncontributory.  Objective: Vital Signs: There were no vitals taken for this visit.  Physical Exam Constitutional:      Appearance: She is normal weight. She is not ill-appearing or diaphoretic.  Pulmonary:     Effort: Pulmonary  effort is normal.  Neurological:     Mental Status: She is alert and oriented to person, place, and time.  Psychiatric:        Mood and Affect: Mood normal.     Ortho Exam Bilateral elbow she has tenderness only over the right elbow lateral epicondyles tenderness over the medial epicondyle.  Extension of the right wrist against resistance causes pain lateral aspect the elbow.  Resisted supination causes pain lateral aspect of the right elbow.  Left elbow no pain with the same maneuvers.  Hands bilaterally otherwise neurovascularly intact. Bilateral hips excellent range of motion.  Discomfort with external rotation of the left hip.  Tenderness over left trochanteric region and down the IT band.  Negative straight leg raise bilaterally. Specialty Comments:  No specialty  comments available.  Imaging: Xr Hip Unilat W Or W/o Pelvis 2-3 Views Left  Result Date: 05/17/2019 Bilateral hips on the AP view show no acute fracture.  Both hips well located.  Hip joints well maintained.  Spurring off the superior acetabulum consistent with impingement.  Joint mouse near the left greater trochanteric region.  Xr Elbow Complete Right (3+view)  Result Date: 05/17/2019 Right elbow 3 views: No acute fracture.  Elbow joint is well-maintained.  No bony abnormalities.  Elbow is well located.    PMFS History: Patient Active Problem List   Diagnosis Date Noted  . Obesity (BMI 30-39.9) 08/06/2017  . Seasonal allergic rhinitis due to pollen 08/06/2017  . Current smoker 09/29/2011   Past Medical History:  Diagnosis Date  . Abnormal finding on Pap smear 05/2012   Dr. Lowella Dell  . Allergy   . Eczema    hands  . GERD (gastroesophageal reflux disease)   . History of mammogram 5/13   normal  . Rosacea    Dr. Allyson Sabal  . Routine gynecological examination 05/2012   Dr. Lowella Dell  . TMJ (dislocation of temporomandibular joint)   . Tobacco use disorder    quit 06/2012    Family History  Problem Relation Age of Onset  . Cancer Mother        lung (smoker)  . Heart disease Father        CABG @ 65  . Diabetes Maternal Aunt   . Diabetes Paternal Aunt        breast  . Diabetes Maternal Aunt        breast cancer  . Stroke Neg Hx   . Hypertension Neg Hx     Past Surgical History:  Procedure Laterality Date  . CHOLECYSTECTOMY    . COLPOSCOPY  2/13  . DILATION AND CURETTAGE OF UTERUS    . KNEE SURGERY     Left--meniscal repair  . SHOULDER ARTHROSCOPY  05/2012   left, Dr. Rush Farmer, [place] History   Occupational History  . Not on file  Tobacco Use  . Smoking status: Current Every Day Smoker    Packs/day: 1.00    Years: 20.00    Pack years: 20.00    Start date: 06/22/2012  . Smokeless tobacco: Never Used  Substance and Sexual Activity  .  Alcohol use: Yes    Alcohol/week: 0.0 standard drinks    Comment: maybe once a month  . Drug use: No  . Sexual activity: Yes

## 2019-07-25 ENCOUNTER — Ambulatory Visit: Payer: BC Managed Care – PPO | Attending: Internal Medicine

## 2019-07-25 DIAGNOSIS — Z20822 Contact with and (suspected) exposure to covid-19: Secondary | ICD-10-CM | POA: Diagnosis not present

## 2019-07-26 LAB — NOVEL CORONAVIRUS, NAA: SARS-CoV-2, NAA: NOT DETECTED

## 2019-08-23 ENCOUNTER — Encounter: Payer: BC Managed Care – PPO | Admitting: Family Medicine

## 2019-09-06 DIAGNOSIS — Z01419 Encounter for gynecological examination (general) (routine) without abnormal findings: Secondary | ICD-10-CM | POA: Diagnosis not present

## 2019-09-06 DIAGNOSIS — Z6836 Body mass index (BMI) 36.0-36.9, adult: Secondary | ICD-10-CM | POA: Diagnosis not present

## 2019-09-06 DIAGNOSIS — Z1231 Encounter for screening mammogram for malignant neoplasm of breast: Secondary | ICD-10-CM | POA: Diagnosis not present

## 2019-09-09 ENCOUNTER — Encounter: Payer: Self-pay | Admitting: Family Medicine

## 2019-09-09 ENCOUNTER — Other Ambulatory Visit: Payer: Self-pay

## 2019-09-09 ENCOUNTER — Ambulatory Visit (INDEPENDENT_AMBULATORY_CARE_PROVIDER_SITE_OTHER): Payer: BC Managed Care – PPO | Admitting: Family Medicine

## 2019-09-09 VITALS — BP 134/82 | HR 68 | Temp 98.0°F | Ht 65.5 in | Wt 221.8 lb

## 2019-09-09 DIAGNOSIS — L309 Dermatitis, unspecified: Secondary | ICD-10-CM

## 2019-09-09 DIAGNOSIS — F341 Dysthymic disorder: Secondary | ICD-10-CM

## 2019-09-09 DIAGNOSIS — E785 Hyperlipidemia, unspecified: Secondary | ICD-10-CM | POA: Diagnosis not present

## 2019-09-09 DIAGNOSIS — J301 Allergic rhinitis due to pollen: Secondary | ICD-10-CM | POA: Diagnosis not present

## 2019-09-09 DIAGNOSIS — Z6379 Other stressful life events affecting family and household: Secondary | ICD-10-CM

## 2019-09-09 DIAGNOSIS — F17200 Nicotine dependence, unspecified, uncomplicated: Secondary | ICD-10-CM | POA: Diagnosis not present

## 2019-09-09 DIAGNOSIS — Z0000 Encounter for general adult medical examination without abnormal findings: Secondary | ICD-10-CM | POA: Diagnosis not present

## 2019-09-09 DIAGNOSIS — I10 Essential (primary) hypertension: Secondary | ICD-10-CM | POA: Diagnosis not present

## 2019-09-09 DIAGNOSIS — E669 Obesity, unspecified: Secondary | ICD-10-CM

## 2019-09-09 DIAGNOSIS — K219 Gastro-esophageal reflux disease without esophagitis: Secondary | ICD-10-CM

## 2019-09-09 LAB — POCT URINALYSIS DIP (PROADVANTAGE DEVICE)
Bilirubin, UA: NEGATIVE
Blood, UA: NEGATIVE
Glucose, UA: NEGATIVE mg/dL
Ketones, POC UA: NEGATIVE mg/dL
Leukocytes, UA: NEGATIVE
Nitrite, UA: NEGATIVE
Protein Ur, POC: NEGATIVE mg/dL
Specific Gravity, Urine: 1.015
Urobilinogen, Ur: 0.2
pH, UA: 6.5 (ref 5.0–8.0)

## 2019-09-09 LAB — HM PAP SMEAR

## 2019-09-09 MED ORDER — ALPRAZOLAM 0.25 MG PO TABS: 0.2500 mg | ORAL_TABLET | Freq: Two times a day (BID) | ORAL | 0 refills | Status: DC | PRN

## 2019-09-09 MED ORDER — LOSARTAN POTASSIUM-HCTZ 100-12.5 MG PO TABS: 1.0000 | ORAL_TABLET | Freq: Every day | ORAL | 3 refills | Status: DC

## 2019-09-09 MED ORDER — BUPROPION HCL ER (SR) 150 MG PO TB12: 150.0000 mg | ORAL_TABLET | Freq: Two times a day (BID) | ORAL | 1 refills | Status: DC

## 2019-09-09 MED ORDER — AMLODIPINE BESYLATE 5 MG PO TABS: 5.0000 mg | ORAL_TABLET | Freq: Every day | ORAL | 3 refills | Status: DC

## 2019-09-09 NOTE — Progress Notes (Signed)
   Subjective:    Patient ID: Dana Wilkins, female    DOB: [birth year], 49 y.o.   MRN: [id number]  HPI She is here for a complete examination.  She was seen by her gynecologist yesterday for Pap and pelvic and also had a mammogram.  She is dealing with her husband recently being diagnosed with pancreatic cancer which is obviously stressing her out.  She would like something to help knock the edge off on an as-needed basis.  She has lost weight and her reflux has essentially gone away.  She is does have some eczema and does use topical preparation mainly on her ears.  She does use an antihistamine as well as nasal steroid spray for her allergies.  She continues to smoke and at this point is not interested in quitting.  She does continue on her Wellbutrin and has been stable on that.  Otherwise family and social history as well as health maintenance and immunizations was reviewed.   Review of Systems  All other systems reviewed and are negative.      Objective:   Physical Exam Alert and in no distress. Tympanic membranes and canals are normal. Pharyngeal area is normal. Neck is supple without adenopathy or thyromegaly. Cardiac exam shows a regular sinus rhythm without murmurs or gallops. Lungs are clear to auscultation.  Abdominal exam shows no masses or tenderness with normal bowel sounds.       Assessment & Plan:  Routine general medical examination at a health care facility - Plan: Lipid Panel, CBC with Differential, Comprehensive metabolic panel, POCT Urinalysis DIP (Proadvantage Device)  Essential hypertension - Plan: CBC with Differential, Comprehensive metabolic panel, losartan-hydrochlorothiazide (HYZAAR) 100-12.5 MG tablet, amLODipine (NORVASC) 5 MG tablet  Smoker - Plan: buPROPion (WELLBUTRIN SR) 150 MG 12 hr tablet  Eczema, unspecified type  Seasonal allergic rhinitis due to pollen  Gastroesophageal reflux disease without esophagitis  Obesity (BMI 30-39.9)  Hyperlipidemia,  unspecified hyperlipidemia type - Plan: Lipid Panel  Stress due to illness of family member - Plan: ALPRAZolam (XANAX) 0.25 MG tablet  Dysthymia  The Wellbutrin was for dysthymia not for smoking.  She will continue on her present medications.  Discussed use of Xanax with her and using it to keep her from going over the edge.  She understands how to handle this. Recommend she use Rhinocort for her allergies as well as an antihistamine. Complemented her on her weight loss and its effect on her her reflux.  Follow-up here as needed especially in regard to the stress and anxiety from her husband's illness.

## 2019-09-10 LAB — COMPREHENSIVE METABOLIC PANEL
ALT: 16 IU/L (ref 0–32)
AST: 13 IU/L (ref 0–40)
Albumin/Globulin Ratio: 3 — ABNORMAL HIGH (ref 1.2–2.2)
Albumin: 5.1 g/dL — ABNORMAL HIGH (ref 3.8–4.8)
Alkaline Phosphatase: 64 IU/L (ref 39–117)
BUN/Creatinine Ratio: 11 (ref 9–23)
BUN: 10 mg/dL (ref 6–24)
Bilirubin Total: 0.4 mg/dL (ref 0.0–1.2)
CO2: 23 mmol/L (ref 20–29)
Calcium: 10.2 mg/dL (ref 8.7–10.2)
Chloride: 100 mmol/L (ref 96–106)
Creatinine, Ser: 0.87 mg/dL (ref 0.57–1.00)
GFR calc Af Amer: 91 mL/min/{1.73_m2} (ref >59)
GFR calc non Af Amer: 79 mL/min/{1.73_m2} (ref >59)
Globulin, Total: 1.7 g/dL (ref 1.5–4.5)
Glucose: 103 mg/dL — ABNORMAL HIGH (ref 65–99)
Potassium: 5 mmol/L (ref 3.5–5.2)
Sodium: 141 mmol/L (ref 134–144)
Total Protein: 6.8 g/dL (ref 6.0–8.5)

## 2019-09-10 LAB — CBC WITH DIFFERENTIAL/PLATELET
Basophils Absolute: 0.1 10*3/uL (ref 0.0–0.2)
Basos: 1 %
EOS (ABSOLUTE): 0.1 10*3/uL (ref 0.0–0.4)
Eos: 1 %
Hematocrit: 45.9 % (ref 34.0–46.6)
Hemoglobin: 16 g/dL — ABNORMAL HIGH (ref 11.1–15.9)
Immature Grans (Abs): 0 10*3/uL (ref 0.0–0.1)
Immature Granulocytes: 0 %
Lymphocytes Absolute: 1.8 10*3/uL (ref 0.7–3.1)
Lymphs: 23 %
MCH: 30.5 pg (ref 26.6–33.0)
MCHC: 34.9 g/dL (ref 31.5–35.7)
MCV: 87 fL (ref 79–97)
Monocytes Absolute: 0.6 10*3/uL (ref 0.1–0.9)
Monocytes: 7 %
Neutrophils Absolute: 5.3 10*3/uL (ref 1.4–7.0)
Neutrophils: 68 %
Platelets: 361 10*3/uL (ref 150–450)
RBC: 5.25 x10E6/uL (ref 3.77–5.28)
RDW: 12.6 % (ref 11.7–15.4)
WBC: 7.9 10*3/uL (ref 3.4–10.8)

## 2019-09-10 LAB — LIPID PANEL
Chol/HDL Ratio: 5.7 ratio — ABNORMAL HIGH (ref 0.0–4.4)
Cholesterol, Total: 210 mg/dL — ABNORMAL HIGH (ref 100–199)
HDL: 37 mg/dL — ABNORMAL LOW (ref >39)
LDL Chol Calc (NIH): 140 mg/dL — ABNORMAL HIGH (ref 0–99)
Triglycerides: 184 mg/dL — ABNORMAL HIGH (ref 0–149)
VLDL Cholesterol Cal: 33 mg/dL (ref 5–40)

## 2019-10-03 ENCOUNTER — Ambulatory Visit: Payer: BC Managed Care – PPO | Attending: Internal Medicine

## 2019-10-03 DIAGNOSIS — Z23 Encounter for immunization: Secondary | ICD-10-CM

## 2019-10-03 NOTE — Progress Notes (Signed)
   Covid-19 Vaccination Clinic  Name:  Dana Wilkins    MRN: [id number] DOB: 06/28/1971  10/03/2019  [name] was observed post Covid-19 immunization for 15 minutes without incident. She was provided with Vaccine Information Sheet and instruction to access the V-Safe system.   [name] was instructed to call 911 with any severe reactions post vaccine: Marland Kitchen Difficulty breathing  . Swelling of face and throat  . A fast heartbeat  . A bad rash all over body  . Dizziness and weakness   Immunizations Administered    Name Date Dose VIS Date Route   Pfizer COVID-19 Vaccine 10/03/2019  8:14 AM 0.3 mL 06/17/2019 Intramuscular   Manufacturer: [employer], [employer]   Lot: [id number]   NDC: [address]-4

## 2019-10-12 ENCOUNTER — Other Ambulatory Visit: Payer: Self-pay | Admitting: Family Medicine

## 2019-10-12 DIAGNOSIS — Z6379 Other stressful life events affecting family and household: Secondary | ICD-10-CM

## 2019-10-12 NOTE — Telephone Encounter (Signed)
[facility] Please advise [facility] 

## 2019-10-25 ENCOUNTER — Ambulatory Visit: Payer: BC Managed Care – PPO | Attending: Internal Medicine

## 2019-10-25 DIAGNOSIS — Z23 Encounter for immunization: Secondary | ICD-10-CM

## 2019-10-25 NOTE — Progress Notes (Signed)
   Covid-19 Vaccination Clinic  Name:  Dana Wilkins    MRN: [id number] DOB: 03-17-1971  10/25/2019  [name] was observed post Covid-19 immunization for 15 minutes without incident. She was provided with Vaccine Information Sheet and instruction to access the V-Safe system.   [name] was instructed to call 911 with any severe reactions post vaccine: Marland Kitchen Difficulty breathing  . Swelling of face and throat  . A fast heartbeat  . A bad rash all over body  . Dizziness and weakness   Immunizations Administered    Name Date Dose VIS Date Route   Pfizer COVID-19 Vaccine 10/25/2019  3:22 PM 0.3 mL 08/31/2018 Intramuscular   Manufacturer: [employer], [employer]   Lot: [id number]   NDC: [address]-0

## 2019-11-08 ENCOUNTER — Other Ambulatory Visit: Payer: Self-pay | Admitting: Family Medicine

## 2019-11-08 DIAGNOSIS — Z6379 Other stressful life events affecting family and household: Secondary | ICD-10-CM

## 2019-11-08 NOTE — Telephone Encounter (Signed)
[facility] Please advise [facility] 

## 2019-11-18 ENCOUNTER — Telehealth: Payer: Self-pay | Admitting: Family Medicine

## 2019-11-18 MED ORDER — FLUCONAZOLE 150 MG PO TABS: 150.0000 mg | ORAL_TABLET | Freq: Once | ORAL | 0 refills | Status: AC

## 2019-11-18 NOTE — Telephone Encounter (Signed)
Pt was advised kh 

## 2019-11-18 NOTE — Telephone Encounter (Signed)
Pt called and said she got an infected tooth pulled earlier this week and has been on antibiotics since then and has developed a bad case of thrush. She has been trying to get a hold of the dentist since yesterday and is unable to reach anyone. She does not want to go the entire weekend in pain and wants to see if you could call her in something or did she need to be seen by someone today? She can be reached at [phone]

## 2019-11-18 NOTE — Telephone Encounter (Signed)
I called in Diflucan.  1 pill will probably work.

## 2019-12-12 ENCOUNTER — Other Ambulatory Visit: Payer: Self-pay | Admitting: Family Medicine

## 2019-12-12 ENCOUNTER — Ambulatory Visit: Payer: BC Managed Care – PPO | Admitting: Family Medicine

## 2019-12-12 ENCOUNTER — Other Ambulatory Visit: Payer: Self-pay

## 2019-12-12 ENCOUNTER — Encounter: Payer: Self-pay | Admitting: Family Medicine

## 2019-12-12 VITALS — BP 142/86 | HR 72 | Temp 98.0°F | Wt 237.2 lb

## 2019-12-12 DIAGNOSIS — M545 Low back pain, unspecified: Secondary | ICD-10-CM

## 2019-12-12 DIAGNOSIS — Z6379 Other stressful life events affecting family and household: Secondary | ICD-10-CM

## 2019-12-12 DIAGNOSIS — M79605 Pain in left leg: Secondary | ICD-10-CM | POA: Diagnosis not present

## 2019-12-12 NOTE — Telephone Encounter (Signed)
[facility] Please advise [facility] 

## 2019-12-12 NOTE — Progress Notes (Signed)
   Subjective:    Patient ID: Dana Wilkins, female    DOB: [birth year], 49 y.o.   MRN: [id number]  HPI She states that she slipped on some steps on Saturday but landed on her foot.  She experienced no pain initially but then after that noted calf and posterior thigh discomfort.  She has been using ibuprofen but not getting much relief.  She also continues to have difficulty with back and hip discomfort.  She has seen Dr. Magnus Ivan in the past for that and states that the physical therapy and injection have not had much benefit.   Review of Systems     Objective:   Physical Exam Alert and complaining of some leg and back discomfort.  She is tender to palpation over the sacral area.  Full motion of the hip without pain.  She complains of pain on palpation of the hamstrings as well as calves although no lesions were palpable.  DTRs and pulses normal.       Assessment & Plan:  Pain of left lower extremity  Left-sided low back pain without sciatica, unspecified chronicity I explained that I thought the leg pain will go away with conservative care and more time.  Recommend that she follow-up with Dr. Magnus Ivan concerning the hip and back pain.  Explained that it is possible that symptoms she is having in her hip could be coming from her back.

## 2019-12-13 ENCOUNTER — Ambulatory Visit: Payer: Self-pay

## 2019-12-13 ENCOUNTER — Encounter: Payer: Self-pay | Admitting: Orthopaedic Surgery

## 2019-12-13 ENCOUNTER — Ambulatory Visit (INDEPENDENT_AMBULATORY_CARE_PROVIDER_SITE_OTHER): Payer: BC Managed Care – PPO | Admitting: Orthopaedic Surgery

## 2019-12-13 DIAGNOSIS — M7062 Trochanteric bursitis, left hip: Secondary | ICD-10-CM

## 2019-12-13 DIAGNOSIS — M5416 Radiculopathy, lumbar region: Secondary | ICD-10-CM

## 2019-12-13 DIAGNOSIS — M79605 Pain in left leg: Secondary | ICD-10-CM | POA: Diagnosis not present

## 2019-12-13 MED ORDER — METHOCARBAMOL 500 MG PO TABS: 500.0000 mg | ORAL_TABLET | Freq: Three times a day (TID) | ORAL | 1 refills | Status: DC

## 2019-12-13 MED ORDER — METHYLPREDNISOLONE 4 MG PO TABS: ORAL_TABLET | ORAL | 0 refills | Status: DC

## 2019-12-13 NOTE — Progress Notes (Signed)
Office Visit Note   Patient: Dana Wilkins           Date of Birth: [birth year]           MRN: [id number] Visit Date: 12/13/2019              Requested by: Dana Lung, MD [place],  [place] [address] PCP: Dana Lung, MD   Assessment & Plan: Visit Diagnoses:  1. Pain in left leg   2. Trochanteric bursitis of left hip   3. Lumbar radicular pain     Plan:  We we will send her to formal physical therapy to work on core strengthening, stretching, home exercise program for both her back and her hip.  And modalities.  She is placed on Medrol Dosepak she will stop ibuprofen while on Medrol Dosepak.  Robaxin mostly to take at night no driving while on the Robaxin.  See her back in 1 month to see what type of response she had to therapy and medications.  Pain persist or becomes worse obtain an MRI of her lumbar spine to rule out HNP source of her radicular symptoms.  Questions were encouraged and answered by Dr. Ninfa Linden and myself.  Follow-Up Instructions: Return in about 4 weeks (around 01/10/2020).   Orders:  Orders Placed This Encounter  Procedures  . XR Lumbar Spine 2-3 Views   Meds ordered this encounter  Medications  . methylPREDNISolone (MEDROL) 4 MG tablet    Sig: Take as directed    Dispense:  21 tablet    Refill:  0  . methocarbamol (ROBAXIN) 500 MG tablet    Sig: Take 1 tablet (500 mg total) by mouth 3 (three) times daily.    Dispense:  40 tablet    Refill:  1      Procedures: No procedures performed   Clinical Data: No additional findings.   Subjective: Chief Complaint  Patient presents with  . Left Leg - Pain    HPI [name] returns today due to left hip and leg pain last week.  States she missed a step set with pain in her thigh and calf since then.  She does have pain that radiates from her left buttocks down into her left calf and she does note some tingling medial aspect of the left ankle.  She states the previous stroke  injection gave her no relief.  Having pain on the lateral aspect of the left hip.  No waking pain.  No saddle anesthesia like symptoms.  No bowel bladder dysfunction.  She is nondiabetic.  Last Covid vaccine was in April.  Review of Systems See HPI otherwise negative or noncontributory.  Objective: Vital Signs: There were no vitals taken for this visit.  Physical Exam Constitutional:      Appearance: She is not ill-appearing or diaphoretic.  Cardiovascular:     Pulses: Normal pulses.  Pulmonary:     Effort: Pulmonary effort is normal.  Neurological:     Mental Status: She is alert and oriented to person, place, and time.  Psychiatric:        Mood and Affect: Mood normal.     Ortho Exam Lower extremities 5 out of 5 strength throughout except for a extension of the left great toe against resistance.  Positive straight leg raise on the left negative on the right.  Tenderness over the left trochanteric region.  She is good flexion-extension of the lumbar spine with some pulling in her left hamstring  region with flexion.:  Good range of motion bilateral hips without pain.  Tenderness over the lower lumbar spinal column and over the paraspinous region on the left.  Subjective decreased sensation left foot over the deep peroneal nerve region of the superficial peroneal nerve region. Specialty Comments:  No specialty comments available.  Imaging: XR Lumbar Spine 2-3 Views  Result Date: 12/13/2019 Lumbar spine 2 views: Loss of lordotic curvature.  No spondylolisthesis.  Endplate spurring lower thoracic upper lumbar vertebral bodies.  No acute fractures.  Arthrosclerosis of aorta    PMFS History: Patient Active Problem List   Diagnosis Date Noted  . Essential hypertension 09/09/2019  . Gastroesophageal reflux disease without esophagitis 09/09/2019  . Hyperlipidemia 09/09/2019  . Dysthymia 09/09/2019  . Obesity (BMI 30-39.9) 08/06/2017  . Seasonal allergic rhinitis due to pollen  08/06/2017  . Current smoker 09/29/2011   Past Medical History:  Diagnosis Date  . Abnormal finding on Pap smear 05/2012   Dr. Lorenso Courier  . Allergy   . Eczema    hands  . GERD (gastroesophageal reflux disease)   . History of mammogram 5/13   normal  . Rosacea    Dr. Terri Piedra  . Routine gynecological examination 05/2012   Dr. Lorenso Courier  . TMJ (dislocation of temporomandibular joint)   . Tobacco use disorder    quit 06/2012    Family History  Problem Relation Age of Onset  . Cancer Mother        Wilkins (smoker)  . Heart disease Father        CABG @ 8  . Diabetes Maternal Aunt   . Diabetes Paternal Aunt        breast  . Diabetes Maternal Aunt        breast cancer  . Stroke Neg Hx   . Hypertension Neg Hx     Past Surgical History:  Procedure Laterality Date  . CHOLECYSTECTOMY    . COLPOSCOPY  2/13  . DILATION AND CURETTAGE OF UTERUS    . KNEE SURGERY     Left--meniscal repair  . SHOULDER ARTHROSCOPY  05/2012   left, Dr. Rayburn Ma, [facility]   Social History   Occupational History  . Not on file  Tobacco Use  . Smoking status: Current Every Day Smoker    Packs/day: 1.00    Years: 20.00    Pack years: 20.00    Start date: 06/22/2012  . Smokeless tobacco: Never Used  Substance and Sexual Activity  . Alcohol use: Yes    Alcohol/week: 0.0 standard drinks    Comment: maybe once a month  . Drug use: No  . Sexual activity: Yes

## 2019-12-29 DIAGNOSIS — M5416 Radiculopathy, lumbar region: Secondary | ICD-10-CM | POA: Diagnosis not present

## 2019-12-29 DIAGNOSIS — M545 Low back pain: Secondary | ICD-10-CM | POA: Diagnosis not present

## 2020-01-03 DIAGNOSIS — M545 Low back pain: Secondary | ICD-10-CM | POA: Diagnosis not present

## 2020-01-03 DIAGNOSIS — M5416 Radiculopathy, lumbar region: Secondary | ICD-10-CM | POA: Diagnosis not present

## 2020-01-05 DIAGNOSIS — M5416 Radiculopathy, lumbar region: Secondary | ICD-10-CM | POA: Diagnosis not present

## 2020-01-05 DIAGNOSIS — M545 Low back pain: Secondary | ICD-10-CM | POA: Diagnosis not present

## 2020-01-10 ENCOUNTER — Ambulatory Visit: Payer: BC Managed Care – PPO | Admitting: Orthopaedic Surgery

## 2020-01-12 ENCOUNTER — Other Ambulatory Visit: Payer: Self-pay | Admitting: Family Medicine

## 2020-01-12 DIAGNOSIS — Z6379 Other stressful life events affecting family and household: Secondary | ICD-10-CM

## 2020-01-12 NOTE — Telephone Encounter (Signed)
[facility] Please advise kh

## 2020-01-17 DIAGNOSIS — M5416 Radiculopathy, lumbar region: Secondary | ICD-10-CM | POA: Diagnosis not present

## 2020-01-17 DIAGNOSIS — M545 Low back pain: Secondary | ICD-10-CM | POA: Diagnosis not present

## 2020-01-19 DIAGNOSIS — M545 Low back pain: Secondary | ICD-10-CM | POA: Diagnosis not present

## 2020-01-19 DIAGNOSIS — M5416 Radiculopathy, lumbar region: Secondary | ICD-10-CM | POA: Diagnosis not present

## 2020-01-23 ENCOUNTER — Other Ambulatory Visit: Payer: Self-pay

## 2020-01-23 ENCOUNTER — Ambulatory Visit (INDEPENDENT_AMBULATORY_CARE_PROVIDER_SITE_OTHER): Payer: BC Managed Care – PPO | Admitting: Orthopaedic Surgery

## 2020-01-23 ENCOUNTER — Encounter: Payer: Self-pay | Admitting: Orthopaedic Surgery

## 2020-01-23 DIAGNOSIS — M5442 Lumbago with sciatica, left side: Secondary | ICD-10-CM

## 2020-01-23 DIAGNOSIS — M5416 Radiculopathy, lumbar region: Secondary | ICD-10-CM

## 2020-01-23 DIAGNOSIS — M79605 Pain in left leg: Secondary | ICD-10-CM

## 2020-01-23 NOTE — Progress Notes (Signed)
The patient is still dealing with significant left-sided sciatica of the fascia getting worse in spite of conservative treatment.  We have tried activity modification as well as rest.  She has been through extensive physical therapy on her lumbar spine.  She has been on a steroid taper and a muscle relaxant as well as anti-inflammatories.  She is still having pain in sciatic region this radiating down the posterior aspect of her thigh to her knee and even now into the calf.  It is hurting worse than her back as well.  X-rays of her lumbar spine did show some disc narrowing at several areas but no acute findings.  On clinical exam, she does have a positive straight leg raise to the left side and significant pain in sciatic region.  There is no weakness in the muscle groups.  At this point given the failed conservative treatment and continued sciatic symptoms, a MRI of the lumbar spine is warranted to rule out herniated disc.  She understands this and is agreeable to this study.  We will work on getting ordered and then see her back in follow-up.

## 2020-01-24 ENCOUNTER — Other Ambulatory Visit: Payer: Self-pay | Admitting: Radiology

## 2020-01-24 DIAGNOSIS — M4807 Spinal stenosis, lumbosacral region: Secondary | ICD-10-CM

## 2020-02-02 ENCOUNTER — Other Ambulatory Visit: Payer: Self-pay

## 2020-02-02 ENCOUNTER — Ambulatory Visit: Payer: BC Managed Care – PPO | Admitting: Family Medicine

## 2020-02-02 ENCOUNTER — Encounter: Payer: Self-pay | Admitting: Family Medicine

## 2020-02-02 VITALS — BP 162/88 | HR 75 | Temp 97.5°F | Wt 237.8 lb

## 2020-02-02 DIAGNOSIS — R399 Unspecified symptoms and signs involving the genitourinary system: Secondary | ICD-10-CM | POA: Diagnosis not present

## 2020-02-02 DIAGNOSIS — R300 Dysuria: Secondary | ICD-10-CM

## 2020-02-02 DIAGNOSIS — M545 Low back pain, unspecified: Secondary | ICD-10-CM

## 2020-02-02 LAB — POCT URINALYSIS DIP (PROADVANTAGE DEVICE)
Bilirubin, UA: NEGATIVE
Blood, UA: NEGATIVE
Glucose, UA: NEGATIVE mg/dL
Ketones, POC UA: NEGATIVE mg/dL
Leukocytes, UA: NEGATIVE
Nitrite, UA: NEGATIVE
Protein Ur, POC: NEGATIVE mg/dL
Specific Gravity, Urine: 1.01
Urobilinogen, Ur: 0.2
pH, UA: 7 (ref 5.0–8.0)

## 2020-02-02 MED ORDER — SULFAMETHOXAZOLE-TRIMETHOPRIM 800-160 MG PO TABS: 1.0000 | ORAL_TABLET | Freq: Two times a day (BID) | ORAL | 0 refills | Status: DC

## 2020-02-02 NOTE — Progress Notes (Signed)
   Subjective:    Patient ID: Dana Wilkins, female    DOB: [birth year], 49 y.o.   MRN: [id number]  HPI She complains of right-sided back pain been bothering her for the last 2 weeks.  Is been intermittent in nature.  Very difficult to get a good history from her but apparently it it is made worse with motion.  She also has had difficulty with urinary urgency, frequency and dysuria unrelated to the back pain.  No fever or chills.   Review of Systems     Objective:   Physical Exam Alert and in no distress.  Slight tenderness palpation over the posterior superior iliac crest area especially with motion. Urine dipstick showed specific gravity of 1010       Assessment & Plan:  UTI symptoms - Plan: POCT Urinalysis DIP (Proadvantage Device)  Dysuria - Plan: sulfamethoxazole-trimethoprim (BACTRIM DS) 800-160 MG tablet  Acute right-sided low back pain without sciatica Recommend 2 Tylenol 4 times per day for the musculoskeletal pain.  She will continue with physical therapy that she has been involved with for the left-sided pain.  Explained that she probably also has a UTI and she is keeping herself well-hydrated that probably interfered with a good urine evaluation.  I will go ahead and treat her.

## 2020-02-02 NOTE — Patient Instructions (Signed)
Continue with 2 Tylenol 4 times per day and also use Azo-Standard to help with the pain with peeing and I will call in an antibiotic

## 2020-02-06 ENCOUNTER — Ambulatory Visit: Payer: BC Managed Care – PPO | Admitting: Physician Assistant

## 2020-02-06 ENCOUNTER — Other Ambulatory Visit: Payer: Self-pay | Admitting: Family Medicine

## 2020-02-06 DIAGNOSIS — Z6379 Other stressful life events affecting family and household: Secondary | ICD-10-CM

## 2020-02-06 NOTE — Telephone Encounter (Signed)
Is this okay to refill? 

## 2020-02-16 DIAGNOSIS — Z20822 Contact with and (suspected) exposure to covid-19: Secondary | ICD-10-CM | POA: Diagnosis not present

## 2020-02-26 ENCOUNTER — Ambulatory Visit
Admission: RE | Admit: 2020-02-26 | Discharge: 2020-02-26 | Disposition: A | Discharge status: Home / Self Care | Payer: BC Managed Care – PPO | Source: Ambulatory Visit | Attending: Orthopaedic Surgery | Admitting: Orthopaedic Surgery

## 2020-02-26 DIAGNOSIS — M48061 Spinal stenosis, lumbar region without neurogenic claudication: Secondary | ICD-10-CM | POA: Diagnosis not present

## 2020-02-26 DIAGNOSIS — M4807 Spinal stenosis, lumbosacral region: Secondary | ICD-10-CM

## 2020-02-29 ENCOUNTER — Encounter: Payer: Self-pay | Admitting: Physician Assistant

## 2020-02-29 ENCOUNTER — Ambulatory Visit (INDEPENDENT_AMBULATORY_CARE_PROVIDER_SITE_OTHER): Payer: BC Managed Care – PPO | Admitting: Physician Assistant

## 2020-02-29 DIAGNOSIS — M5416 Radiculopathy, lumbar region: Secondary | ICD-10-CM

## 2020-02-29 NOTE — Progress Notes (Signed)
HPI: [name] returns today for follow-up for low back pain with radicular pain down the left leg.  Pain radiates down into the calf.  She has no leg pain.  Pains worse transitioning from sitting to standing.  She states that her pain remains unchanged. MRI lumbar spine is reviewed with the patient and with patient.  MRI dated 02/26/2020 shows a broad-based disc bulge that results in stenosis of the lateral recess compressing the left L5 nerve root.  Right foraminal encroachment due to the disc bulge.  Facet arthritis.  Disc bulge adjacent to the right L5 nerve.  But no demonstrated.  Physical exam: Negative straight leg raise:.  Tight hamstrings on the left.  Impression: Lumbar left radicular  Plan: We will refer her for all steroid injection possible L4-5 transforaminal injection.  See her back in 4 weeks to see what type of injection..  Discussed with her the need to continue to keep performing hamstring, IT band stretching and work on core strengthening.

## 2020-03-01 ENCOUNTER — Other Ambulatory Visit: Payer: Self-pay | Admitting: Radiology

## 2020-03-01 DIAGNOSIS — M4807 Spinal stenosis, lumbosacral region: Secondary | ICD-10-CM

## 2020-03-13 ENCOUNTER — Other Ambulatory Visit: Payer: Self-pay | Admitting: Family Medicine

## 2020-03-13 DIAGNOSIS — Z6379 Other stressful life events affecting family and household: Secondary | ICD-10-CM

## 2020-03-13 NOTE — Telephone Encounter (Signed)
[facility]. Please advise [facility] 

## 2020-03-19 ENCOUNTER — Ambulatory Visit (INDEPENDENT_AMBULATORY_CARE_PROVIDER_SITE_OTHER): Payer: BC Managed Care – PPO | Admitting: Physical Medicine and Rehabilitation

## 2020-03-19 ENCOUNTER — Other Ambulatory Visit: Payer: Self-pay

## 2020-03-19 ENCOUNTER — Ambulatory Visit: Payer: Self-pay

## 2020-03-19 ENCOUNTER — Encounter: Payer: Self-pay | Admitting: Physical Medicine and Rehabilitation

## 2020-03-19 VITALS — BP 180/89 | HR 96

## 2020-03-19 DIAGNOSIS — M5416 Radiculopathy, lumbar region: Secondary | ICD-10-CM

## 2020-03-19 MED ORDER — METHYLPREDNISOLONE ACETATE 80 MG/ML IJ SUSP
80.0000 mg | Freq: Once | INTRAMUSCULAR | Status: AC
  Administered 2020-03-19: 80 mg

## 2020-03-19 NOTE — Progress Notes (Signed)
Dana Wilkins - 49 y.o. female MRN [id number]  Date of birth: 26-Mar-1971  Office Visit Note: Visit Date: 03/19/2020 PCP: Ronnald Nian, MD Referred by: Ronnald Nian, MD  Subjective: Chief Complaint  Patient presents with  . Lower Back - Pain  . Left Thigh - Pain   HPI:  Dana Wilkins is a 49 y.o. female who comes in today at the request of Rexene Edison, PA-C for planned Left L4-L5 Lumbar epidural steroid injection with fluoroscopic guidance.  The patient has failed conservative care including home exercise, medications, time and activity modification.  This injection will be diagnostic and hopefully therapeutic.  Please see requesting physician notes for further details and justification.  MRI reviewed with images and spine model.  MRI reviewed in the note below.    ROS Otherwise per HPI.  Assessment & Plan: Visit Diagnoses:  1. Lumbar radiculopathy     Plan: No additional findings.   Meds & Orders:  Meds ordered this encounter  Medications  . methylPREDNISolone acetate (DEPO-MEDROL) injection 80 mg    Orders Placed This Encounter  Procedures  . XR C-ARM NO REPORT  . Epidural Steroid injection    Follow-up: Return if symptoms worsen or fail to improve.   Procedures: No procedures performed  Lumbosacral Transforaminal Epidural Steroid Injection - Sub-Pedicular Approach with Fluoroscopic Guidance  Patient: Dana Wilkins      Date of Birth: [birth year] MRN: [id number] PCP: Ronnald Nian, MD      Visit Date: 03/19/2020   Universal Protocol:    Date/Time: 03/19/2020  Consent Given By: the patient  Position: PRONE  Additional Comments: Vital signs were monitored before and after the procedure. Patient was prepped and draped in the usual sterile fashion. The correct patient, procedure, and site was verified.   Injection Procedure Details:  Procedure Site One Meds Administered:  Meds ordered this encounter  Medications  . methylPREDNISolone acetate  (DEPO-MEDROL) injection 80 mg    Laterality: Left  Location/Site:  L5-S1  Needle size: 22 G  Needle type: Spinal  Needle Placement: Transforaminal  Findings:    -Comments: Excellent flow of contrast along the nerve, nerve root and into the epidural space.  Procedure Details: After squaring off the end-plates to get a true AP view, the C-arm was positioned so that an oblique view of the foramen as noted above was visualized. The target area is just inferior to the "nose of the scotty dog" or sub pedicular. The soft tissues overlying this structure were infiltrated with 2-3 ml. of 1% Lidocaine without Epinephrine.  The spinal needle was inserted toward the target using a "trajectory" view along the fluoroscope beam.  Under AP and lateral visualization, the needle was advanced so it did not puncture dura and was located close the 6 O'Clock position of the pedical in AP tracterory. Biplanar projections were used to confirm position. Aspiration was confirmed to be negative for CSF and/or blood. A 1-2 ml. volume of Isovue-250 was injected and flow of contrast was noted at each level. Radiographs were obtained for documentation purposes.   After attaining the desired flow of contrast documented above, a 0.5 to 1.0 ml test dose of 0.25% Marcaine was injected into each respective transforaminal space.  The patient was observed for 90 seconds post injection.  After no sensory deficits were reported, and normal lower extremity motor function was noted,   the above injectate was administered so that equal amounts of the injectate were placed at each foramen (  level) into the transforaminal epidural space.   Additional Comments:  The patient tolerated the procedure well Dressing: 2 x 2 sterile gauze and Band-Aid    Post-procedure details: Patient was observed during the procedure. Post-procedure instructions were reviewed.  Patient left the clinic in stable condition.      Clinical  History: Left hip and leg pain over the last year.  EXAM: MRI LUMBAR SPINE WITHOUT CONTRAST  TECHNIQUE: Multiplanar, multisequence MR imaging of the lumbar spine was performed. No intravenous contrast was administered.  COMPARISON:  Radiography 12/13/2019  FINDINGS: Segmentation:  5 lumbar type vertebral bodies assumed.  Alignment: Minimal curvature convex to the right with the apex at L3.  Vertebrae:  No fracture or primary bone lesion.  Conus medullaris and cauda equina: Conus extends to the L2 level. Conus and cauda equina appear normal.  Paraspinal and other soft tissues: Negative  Disc levels:  L1-2: Minimal noncompressive disc bulge.  L2-3: Normal interspace.  L3-4: Normal interspace.  L4-5: Disc protrusion in the central to left posterolateral direction. Mild facet and ligamentous prominence. Stenosis of the lateral recesses left more than right. Left L5 nerve root compression likely occurs in the lateral recess. Minor left foraminal encroachment by disc material could also affect the L4 nerve.  L5-S1: Bulging of the disc. Focally prominent in the right extraforaminal region. Mild facet osteoarthritis. No compressive canal stenosis. Some potential the right L5 could be irritated by the extraforaminal disease.  IMPRESSION: L4-5: Broad-based disc protrusion more prominent towards the left. Stenosis of the lateral recess likely to compress the left L5 nerve. Mild right foraminal encroachment by disc material could also affect the exiting L4 nerve, though less likely.  L5-S1: Disc bulge with right extraforaminal prominence. Some potential this could irritate the adjacent right L5 nerve, but nerve compression is not demonstrated.  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   Electronically Signed   By: Paulina Fusi M.D.   On: 02/26/2020 19:11     Objective:  VS:  HT:    WT:   BMI:     BP:(!)  180/89  HR:96bpm  TEMP: ( )  RESP:  Physical Exam Constitutional:      General: She is not in acute distress.    Appearance: Normal appearance. She is not ill-appearing.  HENT:     Head: Normocephalic and atraumatic.     Right Ear: External ear normal.     Left Ear: External ear normal.  Eyes:     Extraocular Movements: Extraocular movements intact.  Cardiovascular:     Rate and Rhythm: Normal rate.     Pulses: Normal pulses.  Musculoskeletal:     Right lower leg: No edema.     Left lower leg: No edema.     Comments: Patient has good distal strength with no pain over the greater trochanters.  No clonus or focal weakness.  Skin:    Findings: No erythema, lesion or rash.  Neurological:     General: No focal deficit present.     Mental Status: She is alert and oriented to person, place, and time.     Sensory: No sensory deficit.     Motor: No weakness or abnormal muscle tone.     Coordination: Coordination normal.  Psychiatric:        Mood and Affect: Mood normal.        Behavior: Behavior normal.      Imaging: No results found.

## 2020-03-19 NOTE — Progress Notes (Signed)
Pt state lower back pain that runs down her left thigh. Pt state walking makes to the pain worse. Pt state stop walking and take a rest to ease the pain.  Numeric Pain Rating Scale and Functional Assessment Average Pain 3   In the last MONTH (on 0-10 scale) has pain interfered with the following?  1. General activity like being  able to carry out your everyday physical activities such as walking, climbing stairs, carrying groceries, or moving a chair?  Rating(8)   +Driver, -BT, -Dye Allergies.

## 2020-03-19 NOTE — Procedures (Signed)
Lumbosacral Transforaminal Epidural Steroid Injection - Sub-Pedicular Approach with Fluoroscopic Guidance  Patient: Dana Wilkins      Date of Birth: [birth year] MRN: [id number] PCP: Ronnald Nian, MD      Visit Date: 03/19/2020   Universal Protocol:    Date/Time: 03/19/2020  Consent Given By: the patient  Position: PRONE  Additional Comments: Vital signs were monitored before and after the procedure. Patient was prepped and draped in the usual sterile fashion. The correct patient, procedure, and site was verified.   Injection Procedure Details:  Procedure Site One Meds Administered:  Meds ordered this encounter  Medications  . methylPREDNISolone acetate (DEPO-MEDROL) injection 80 mg    Laterality: Left  Location/Site:  L5-S1  Needle size: 22 G  Needle type: Spinal  Needle Placement: Transforaminal  Findings:    -Comments: Excellent flow of contrast along the nerve, nerve root and into the epidural space.  Procedure Details: After squaring off the end-plates to get a true AP view, the C-arm was positioned so that an oblique view of the foramen as noted above was visualized. The target area is just inferior to the "nose of the scotty dog" or sub pedicular. The soft tissues overlying this structure were infiltrated with 2-3 ml. of 1% Lidocaine without Epinephrine.  The spinal needle was inserted toward the target using a "trajectory" view along the fluoroscope beam.  Under AP and lateral visualization, the needle was advanced so it did not puncture dura and was located close the 6 O'Clock position of the pedical in AP tracterory. Biplanar projections were used to confirm position. Aspiration was confirmed to be negative for CSF and/or blood. A 1-2 ml. volume of Isovue-250 was injected and flow of contrast was noted at each level. Radiographs were obtained for documentation purposes.   After attaining the desired flow of contrast documented above, a 0.5 to 1.0 ml test  dose of 0.25% Marcaine was injected into each respective transforaminal space.  The patient was observed for 90 seconds post injection.  After no sensory deficits were reported, and normal lower extremity motor function was noted,   the above injectate was administered so that equal amounts of the injectate were placed at each foramen (level) into the transforaminal epidural space.   Additional Comments:  The patient tolerated the procedure well Dressing: 2 x 2 sterile gauze and Band-Aid    Post-procedure details: Patient was observed during the procedure. Post-procedure instructions were reviewed.  Patient left the clinic in stable condition.

## 2020-03-28 ENCOUNTER — Ambulatory Visit (INDEPENDENT_AMBULATORY_CARE_PROVIDER_SITE_OTHER): Payer: BC Managed Care – PPO | Admitting: Orthopaedic Surgery

## 2020-03-28 ENCOUNTER — Encounter: Payer: Self-pay | Admitting: Orthopaedic Surgery

## 2020-03-28 DIAGNOSIS — M4807 Spinal stenosis, lumbosacral region: Secondary | ICD-10-CM

## 2020-03-28 NOTE — Progress Notes (Signed)
Dana Wilkins comes in for follow-up after having an epidural steroid injection by Dr. Alvester Morin on September 13 which was just a week and a half ago.  She said she still having a little bit of pain but she is able to walk better and is not unbearable.  She is doing much better overall and is only taken some Tylenol and is giving her some relief.  The ESI was to the left at L5-S1.  On exam today she looks very comfortable.  She is walking with great posture.  She has excellent strength in the bilateral lower extremities with normal sensation and a negative straight leg raise to the left side.  At this point follow-up can be as needed.  If things worsen at all she will let us know.  She can always call for repeat injection in her lumbar spine if needed.  All questions and concerns were answered and addressed.

## 2020-04-12 ENCOUNTER — Other Ambulatory Visit: Payer: Self-pay | Admitting: Family Medicine

## 2020-04-12 DIAGNOSIS — Z6379 Other stressful life events affecting family and household: Secondary | ICD-10-CM

## 2020-04-12 NOTE — Telephone Encounter (Signed)
[facility]. Please advise [facility] 

## 2020-05-15 ENCOUNTER — Other Ambulatory Visit: Payer: Self-pay | Admitting: Family Medicine

## 2020-05-15 DIAGNOSIS — Z6379 Other stressful life events affecting family and household: Secondary | ICD-10-CM

## 2020-05-15 NOTE — Telephone Encounter (Signed)
[facility] please advise [facility] 

## 2020-08-12 ENCOUNTER — Other Ambulatory Visit: Payer: Self-pay | Admitting: Family Medicine

## 2020-08-12 DIAGNOSIS — Z6379 Other stressful life events affecting family and household: Secondary | ICD-10-CM

## 2020-08-13 NOTE — Telephone Encounter (Signed)
[facility] Please advise [facility] 

## 2020-09-03 ENCOUNTER — Other Ambulatory Visit: Payer: Self-pay | Admitting: Family Medicine

## 2020-09-03 DIAGNOSIS — I10 Essential (primary) hypertension: Secondary | ICD-10-CM

## 2020-09-04 ENCOUNTER — Other Ambulatory Visit: Payer: Self-pay | Admitting: Family Medicine

## 2020-09-04 ENCOUNTER — Telehealth: Payer: Self-pay

## 2020-09-04 DIAGNOSIS — I10 Essential (primary) hypertension: Secondary | ICD-10-CM

## 2020-09-04 MED ORDER — HYDROCHLOROTHIAZIDE 12.5 MG PO TABS: 12.5000 mg | ORAL_TABLET | Freq: Every day | ORAL | 0 refills | Status: DC

## 2020-09-04 MED ORDER — LOSARTAN POTASSIUM 100 MG PO TABS
100.0000 mg | ORAL_TABLET | Freq: Every day | ORAL | 0 refills | Status: DC
Start: 2020-09-04 — End: 2020-12-24

## 2020-09-04 NOTE — Telephone Encounter (Signed)
Pt called having issues with getting Hyzaar.  [facility] and there is long term back order with combo losartan or losartan by itself.  Called pt back and she has some left, she will check around and see if any other pharmacies that have it in stock and call me back

## 2020-09-06 NOTE — Telephone Encounter (Signed)
Sent my chart message to find out if she wanted to use a different pharmacy. KH

## 2020-09-07 MED ORDER — LOSARTAN POTASSIUM-HCTZ 100-12.5 MG PO TABS: 1.0000 | ORAL_TABLET | Freq: Every day | ORAL | 0 refills | Status: DC

## 2020-09-11 ENCOUNTER — Encounter: Payer: BC Managed Care – PPO | Admitting: Family Medicine

## 2020-10-02 ENCOUNTER — Other Ambulatory Visit: Payer: Self-pay

## 2020-10-02 ENCOUNTER — Encounter: Payer: Self-pay | Admitting: Family Medicine

## 2020-10-02 ENCOUNTER — Ambulatory Visit: Payer: BC Managed Care – PPO | Admitting: Family Medicine

## 2020-10-02 VITALS — BP 150/80 | HR 79 | Temp 98.1°F | Ht 65.5 in | Wt 232.6 lb

## 2020-10-02 DIAGNOSIS — J301 Allergic rhinitis due to pollen: Secondary | ICD-10-CM

## 2020-10-02 DIAGNOSIS — Z0000 Encounter for general adult medical examination without abnormal findings: Secondary | ICD-10-CM

## 2020-10-02 DIAGNOSIS — F17200 Nicotine dependence, unspecified, uncomplicated: Secondary | ICD-10-CM

## 2020-10-02 DIAGNOSIS — L309 Dermatitis, unspecified: Secondary | ICD-10-CM

## 2020-10-02 DIAGNOSIS — E669 Obesity, unspecified: Secondary | ICD-10-CM

## 2020-10-02 DIAGNOSIS — Z1211 Encounter for screening for malignant neoplasm of colon: Secondary | ICD-10-CM

## 2020-10-02 DIAGNOSIS — E785 Hyperlipidemia, unspecified: Secondary | ICD-10-CM | POA: Diagnosis not present

## 2020-10-02 DIAGNOSIS — Z6379 Other stressful life events affecting family and household: Secondary | ICD-10-CM | POA: Diagnosis not present

## 2020-10-02 DIAGNOSIS — I10 Essential (primary) hypertension: Secondary | ICD-10-CM

## 2020-10-02 DIAGNOSIS — R7309 Other abnormal glucose: Secondary | ICD-10-CM | POA: Diagnosis not present

## 2020-10-02 DIAGNOSIS — K219 Gastro-esophageal reflux disease without esophagitis: Secondary | ICD-10-CM

## 2020-10-02 DIAGNOSIS — E7439 Other disorders of intestinal carbohydrate absorption: Secondary | ICD-10-CM | POA: Diagnosis not present

## 2020-10-02 DIAGNOSIS — F341 Dysthymic disorder: Secondary | ICD-10-CM

## 2020-10-02 LAB — POCT URINALYSIS DIP (PROADVANTAGE DEVICE)
Bilirubin, UA: NEGATIVE
Blood, UA: NEGATIVE
Glucose, UA: NEGATIVE mg/dL
Ketones, POC UA: NEGATIVE mg/dL
Leukocytes, UA: NEGATIVE
Nitrite, UA: NEGATIVE
Protein Ur, POC: NEGATIVE mg/dL
Specific Gravity, Urine: 1.01
Urobilinogen, Ur: 0.2
pH, UA: 7 (ref 5.0–8.0)

## 2020-10-02 MED ORDER — AMLODIPINE BESYLATE 10 MG PO TABS: 10.0000 mg | ORAL_TABLET | Freq: Every day | ORAL | 3 refills | Status: DC

## 2020-10-02 NOTE — Progress Notes (Addendum)
   Subjective:    Patient ID: Dana Wilkins, female    DOB: 07-17-1970, 50 y.o.   MRN: [id number]  HPI She is here for complete examination.  She apparently did have Covid and is not interested in immunizations.  She continues to smoke and does plan on quitting in the near future.  She will quit when her husband plans to quit so they can do it together.  She is also dealing with the fact that he has pancreatic cancer.  At this point he seems to be holding his own.  So far psychologically she seems to do well just using Xanax.  She does have underlying allergies and is on OTC meds for that.  She continues on losartan/HCTZ is well as amlodipine.  She is not having any skin problems at the present time she does plan on getting Pap and mammogram in May from her gynecologist.  Family and social history as well as health maintenance and immunizations was reviewed   Review of Systems     Objective:   Physical Exam Alert and in no distress. Tympanic membranes and canals are normal. Pharyngeal area is normal. Neck is supple without adenopathy or thyromegaly. Cardiac exam shows a regular sinus rhythm without murmurs or gallops. Lungs are clear to auscultation.        Assessment & Plan:  Routine general medical examination at a health care facility - Plan: POCT Urinalysis DIP (Proadvantage Device)  Stress due to illness of family member  Eczema, unspecified type  Smoker  Seasonal allergic rhinitis due to pollen  Gastroesophageal reflux disease without esophagitis  Essential hypertension - Plan: amLODipine (NORVASC) 10 MG tablet, CBC with Differential/Platelet, Comprehensive metabolic panel, POCT Urinalysis DIP (Proadvantage Device)  Obesity (BMI 30-39.9)  Current smoker  Hyperlipidemia, unspecified hyperlipidemia type - Plan: Lipid panel  Dysthymia  Screening for colon cancer - Plan: Cologuard  I discussed briefly diet and exercise with her as well as smoking cessation.  At the  present time I will not pursue this due to the dealing with her husband and pancreatic cancer.  Continue to use Xanax on an as needed basis.  Continue on Wellbutrin.  Discussed her allergies with her and recommend continuing on OTC medications and switching when the antihistamine stops working.  Explained the idea behind tachyphylaxis  Blood work drawn today did show a hemoglobin A1c of 6.1 indicating glucose intolerance

## 2020-10-02 NOTE — Patient Instructions (Signed)
Check your blood pressure weekly and in about a month send me a message on my chart as to what your numbers

## 2020-10-03 LAB — CBC WITH DIFFERENTIAL/PLATELET
Basophils Absolute: 0.1 10*3/uL (ref 0.0–0.2)
Basos: 1 %
EOS (ABSOLUTE): 0.1 10*3/uL (ref 0.0–0.4)
Eos: 1 %
Hematocrit: 45.9 % (ref 34.0–46.6)
Hemoglobin: 15.3 g/dL (ref 11.1–15.9)
Immature Grans (Abs): 0 10*3/uL (ref 0.0–0.1)
Immature Granulocytes: 0 %
Lymphocytes Absolute: 1.7 10*3/uL (ref 0.7–3.1)
Lymphs: 21 %
MCH: 29.1 pg (ref 26.6–33.0)
MCHC: 33.3 g/dL (ref 31.5–35.7)
MCV: 87 fL (ref 79–97)
Monocytes Absolute: 0.6 10*3/uL (ref 0.1–0.9)
Monocytes: 7 %
Neutrophils Absolute: 5.7 10*3/uL (ref 1.4–7.0)
Neutrophils: 70 %
Platelets: 360 10*3/uL (ref 150–450)
RBC: 5.26 x10E6/uL (ref 3.77–5.28)
RDW: 12.9 % (ref 11.7–15.4)
WBC: 8.2 10*3/uL (ref 3.4–10.8)

## 2020-10-03 LAB — COMPREHENSIVE METABOLIC PANEL
ALT: 18 IU/L (ref 0–32)
AST: 16 IU/L (ref 0–40)
Albumin/Globulin Ratio: 2 (ref 1.2–2.2)
Albumin: 4.7 g/dL (ref 3.8–4.8)
Alkaline Phosphatase: 68 IU/L (ref 44–121)
BUN/Creatinine Ratio: 11 (ref 9–23)
BUN: 9 mg/dL (ref 6–24)
Bilirubin Total: 0.5 mg/dL (ref 0.0–1.2)
CO2: 23 mmol/L (ref 20–29)
Calcium: 9.7 mg/dL (ref 8.7–10.2)
Chloride: 103 mmol/L (ref 96–106)
Creatinine, Ser: 0.85 mg/dL (ref 0.57–1.00)
Globulin, Total: 2.3 g/dL (ref 1.5–4.5)
Glucose: 112 mg/dL — ABNORMAL HIGH (ref 65–99)
Potassium: 4.8 mmol/L (ref 3.5–5.2)
Sodium: 143 mmol/L (ref 134–144)
Total Protein: 7 g/dL (ref 6.0–8.5)
eGFR: 83 mL/min/{1.73_m2} (ref >59)

## 2020-10-03 LAB — LIPID PANEL
Chol/HDL Ratio: 5.7 ratio — ABNORMAL HIGH (ref 0.0–4.4)
Cholesterol, Total: 222 mg/dL — ABNORMAL HIGH (ref 100–199)
HDL: 39 mg/dL — ABNORMAL LOW (ref >39)
LDL Chol Calc (NIH): 152 mg/dL — ABNORMAL HIGH (ref 0–99)
Triglycerides: 170 mg/dL — ABNORMAL HIGH (ref 0–149)
VLDL Cholesterol Cal: 31 mg/dL (ref 5–40)

## 2020-10-06 DIAGNOSIS — E7439 Other disorders of intestinal carbohydrate absorption: Secondary | ICD-10-CM | POA: Insufficient documentation

## 2020-10-06 LAB — SPECIMEN STATUS REPORT

## 2020-10-06 LAB — HGB A1C W/O EAG: Hgb A1c MFr Bld: 6.1 % — ABNORMAL HIGH (ref 4.8–5.6)

## 2020-10-07 DIAGNOSIS — Z1211 Encounter for screening for malignant neoplasm of colon: Secondary | ICD-10-CM | POA: Diagnosis not present

## 2020-10-09 LAB — COLOGUARD: Cologuard: NEGATIVE

## 2020-10-13 LAB — COLOGUARD

## 2020-10-28 ENCOUNTER — Other Ambulatory Visit: Payer: Self-pay | Admitting: Family Medicine

## 2020-10-28 DIAGNOSIS — I10 Essential (primary) hypertension: Secondary | ICD-10-CM

## 2020-11-09 ENCOUNTER — Other Ambulatory Visit: Payer: Self-pay | Admitting: Family Medicine

## 2020-11-09 DIAGNOSIS — Z6379 Other stressful life events affecting family and household: Secondary | ICD-10-CM

## 2020-11-09 NOTE — Telephone Encounter (Signed)
[facility]. Please advise [facility] 

## 2020-11-24 ENCOUNTER — Other Ambulatory Visit: Payer: Self-pay | Admitting: Family Medicine

## 2020-11-24 DIAGNOSIS — F17200 Nicotine dependence, unspecified, uncomplicated: Secondary | ICD-10-CM

## 2020-11-26 NOTE — Telephone Encounter (Signed)
[facility]. Please advise [facility] 

## 2020-11-29 DIAGNOSIS — Z1231 Encounter for screening mammogram for malignant neoplasm of breast: Secondary | ICD-10-CM | POA: Diagnosis not present

## 2020-11-29 DIAGNOSIS — Z01419 Encounter for gynecological examination (general) (routine) without abnormal findings: Secondary | ICD-10-CM | POA: Diagnosis not present

## 2020-11-29 DIAGNOSIS — Z6838 Body mass index (BMI) 38.0-38.9, adult: Secondary | ICD-10-CM | POA: Diagnosis not present

## 2020-11-29 LAB — HM MAMMOGRAPHY

## 2020-12-05 ENCOUNTER — Encounter: Payer: Self-pay | Admitting: Family Medicine

## 2020-12-24 ENCOUNTER — Other Ambulatory Visit: Payer: Self-pay

## 2020-12-24 ENCOUNTER — Ambulatory Visit: Payer: BC Managed Care – PPO | Admitting: Family Medicine

## 2020-12-24 VITALS — BP 146/90 | HR 81 | Temp 98.4°F | Wt 236.2 lb

## 2020-12-24 DIAGNOSIS — L03119 Cellulitis of unspecified part of limb: Secondary | ICD-10-CM | POA: Diagnosis not present

## 2020-12-24 MED ORDER — DOXYCYCLINE HYCLATE 100 MG PO TABS: 100.0000 mg | ORAL_TABLET | Freq: Two times a day (BID) | ORAL | 0 refills | Status: DC

## 2020-12-24 NOTE — Progress Notes (Signed)
   Subjective:    Patient ID: Dana Wilkins, female    DOB: [birth year], 50 y.o.   MRN: [id number]  HPI She states that roughly 3 days ago she noted difficulty with bites on her lower extremities.  They have remained red and 1 on the left side has become more tender with some linear streaking.   Review of Systems     Objective:   Physical Exam Exam of both lower posterior calf areas does show a healing lesion of approximately half a centimeter in size with some slight erythema around that with the 1 on the left showing some slight streaking laterally.      Assessment & Plan:  Cellulitis of lower extremity, unspecified laterality - Plan: doxycycline (VIBRA-TABS) 100 MG tablet I think it is reasonable to see if doxycycline will help with this since I think this could be a slight cellulitis.  She was comfortable with that and will keep me informed as to the success.

## 2020-12-25 ENCOUNTER — Other Ambulatory Visit: Payer: Self-pay | Admitting: Family Medicine

## 2020-12-25 DIAGNOSIS — I10 Essential (primary) hypertension: Secondary | ICD-10-CM

## 2021-01-31 ENCOUNTER — Encounter: Payer: Self-pay | Admitting: Family Medicine

## 2021-03-08 ENCOUNTER — Other Ambulatory Visit: Payer: Self-pay | Admitting: Family Medicine

## 2021-03-08 DIAGNOSIS — Z6379 Other stressful life events affecting family and household: Secondary | ICD-10-CM

## 2021-03-08 NOTE — Telephone Encounter (Signed)
Is this okay refill? 

## 2021-03-08 NOTE — Telephone Encounter (Signed)
Chart and PDMP reviewed.  She has been taking it daily, and he has been filling it monthly.

## 2021-03-25 ENCOUNTER — Other Ambulatory Visit: Payer: Self-pay | Admitting: Family Medicine

## 2021-03-25 DIAGNOSIS — I10 Essential (primary) hypertension: Secondary | ICD-10-CM

## 2021-04-04 ENCOUNTER — Other Ambulatory Visit: Payer: Self-pay | Admitting: Family Medicine

## 2021-04-04 DIAGNOSIS — Z6379 Other stressful life events affecting family and household: Secondary | ICD-10-CM

## 2021-04-04 NOTE — Telephone Encounter (Signed)
Is this okay to refill? 

## 2021-05-06 ENCOUNTER — Other Ambulatory Visit: Payer: Self-pay | Admitting: Family Medicine

## 2021-05-06 DIAGNOSIS — Z6379 Other stressful life events affecting family and household: Secondary | ICD-10-CM

## 2021-05-06 NOTE — Telephone Encounter (Signed)
[facility]. Please advise [facility] 

## 2021-06-03 ENCOUNTER — Telehealth: Payer: Self-pay

## 2021-06-03 DIAGNOSIS — F17200 Nicotine dependence, unspecified, uncomplicated: Secondary | ICD-10-CM

## 2021-06-03 MED ORDER — BUPROPION HCL ER (SR) 150 MG PO TB12: 150.0000 mg | ORAL_TABLET | Freq: Two times a day (BID) | ORAL | 1 refills | Status: DC

## 2021-06-03 NOTE — Telephone Encounter (Signed)
Pt left message needs refill Bupropion

## 2021-06-04 ENCOUNTER — Other Ambulatory Visit: Payer: Self-pay | Admitting: Family Medicine

## 2021-06-04 DIAGNOSIS — Z6379 Other stressful life events affecting family and household: Secondary | ICD-10-CM

## 2021-06-04 NOTE — Telephone Encounter (Signed)
[facility]. Please advise [facility] 

## 2021-06-22 ENCOUNTER — Other Ambulatory Visit: Payer: Self-pay | Admitting: Family Medicine

## 2021-06-22 DIAGNOSIS — I10 Essential (primary) hypertension: Secondary | ICD-10-CM

## 2021-06-26 ENCOUNTER — Encounter: Payer: Self-pay | Admitting: Family Medicine

## 2021-06-28 ENCOUNTER — Telehealth: Payer: BC Managed Care – PPO | Admitting: Family Medicine

## 2021-06-28 ENCOUNTER — Encounter: Payer: Self-pay | Admitting: Family Medicine

## 2021-06-28 ENCOUNTER — Other Ambulatory Visit: Payer: Self-pay

## 2021-06-28 VITALS — Ht 66.0 in | Wt 225.0 lb

## 2021-06-28 DIAGNOSIS — J0100 Acute maxillary sinusitis, unspecified: Secondary | ICD-10-CM | POA: Diagnosis not present

## 2021-06-28 MED ORDER — CLARITHROMYCIN 500 MG PO TABS: 500.0000 mg | ORAL_TABLET | Freq: Two times a day (BID) | ORAL | 0 refills | Status: DC

## 2021-06-28 NOTE — Progress Notes (Signed)
° °  Subjective:    Patient ID: Dana Wilkins, female    DOB: 1971/03/05, 50 y.o.   MRN: [id number]  HPI Documentation for virtual audio and video telecommunications through Caregility encounter: The patient was located at home. 2 patient identifiers used.  The provider was located in the office. The patient did consent to this visit and is aware of possible charges through their insurance for this visit. The other persons participating in this telemedicine service were none. Time spent on call was 5 minutes and in review of previous records >18 minutes total for counseling and coordination of care. This virtual service is not related to other E/M service within previous 7 days.  She states that 1 month ago she developed maxillary and frontal sinus pressure and rhinorrhea.  These responded to conservative care and she did get better until about 2 days ago when the drainage became purulent with left maxillary sinus pain and pressure, upper tooth discomfort.  No fever, chills, cough or sore throat.  Review of Systems     Objective:   Physical Exam Alert and in no distress otherwise not examined       Assessment & Plan:   Acute non-recurrent maxillary sinusitis - Plan: clarithromycin (BIAXIN) 500 MG tablet His symptoms are consistent with a sinusitis.  I will treat her with Biaxin as she does have GI irritation from Amoxil.  She will call if not entirely better when she finishes the antibiotic.

## 2021-07-05 ENCOUNTER — Other Ambulatory Visit: Payer: Self-pay | Admitting: Family Medicine

## 2021-07-05 DIAGNOSIS — Z6379 Other stressful life events affecting family and household: Secondary | ICD-10-CM

## 2021-08-02 ENCOUNTER — Encounter: Payer: Self-pay | Admitting: Family Medicine

## 2021-08-05 ENCOUNTER — Other Ambulatory Visit: Payer: Self-pay | Admitting: Family Medicine

## 2021-08-05 ENCOUNTER — Other Ambulatory Visit: Payer: Self-pay

## 2021-08-05 ENCOUNTER — Telehealth: Payer: BC Managed Care – PPO | Admitting: Family Medicine

## 2021-08-05 DIAGNOSIS — Z6379 Other stressful life events affecting family and household: Secondary | ICD-10-CM

## 2021-08-05 DIAGNOSIS — J069 Acute upper respiratory infection, unspecified: Secondary | ICD-10-CM

## 2021-08-05 NOTE — Progress Notes (Signed)
° °  Subjective:    Patient ID: Dana Wilkins, female    DOB: Aug 18, 1970, 51 y.o.   MRN: [id number]  HPI Documentation for virtual audio and video telecommunications through Caregility encounter: The patient was located at home. 2 patient identifiers used.  The provider was located in the office. The patient did consent to this visit and is aware of possible charges through their insurance for this visit. The other persons participating in this telemedicine service were none. Time spent on call was 5 minutes and in review of previous records >17 minutes total for counseling and coordination of care. This virtual service is not related to other E/M service within previous 7 days.  She states that Tuesday of last week she developed a slight cough with postnasal drainage, purulent rhinorrhea, frontal and maxillary sinus pressure, upper tooth discomfort.  No fever, chills, sore throat.  She states today that she actually feels better.  Review of Systems     Objective:   Physical Exam Alert and in no distress otherwise not examined      Assessment & Plan:  Viral upper respiratory tract infection I stated that since she is feeling better this is most likely a viral infection and recommend continued conservative care.  She can take Tylenol for the discomfort and a decongestant to help with the pressure.  She was comfortable with that.

## 2021-09-03 ENCOUNTER — Other Ambulatory Visit: Payer: Self-pay | Admitting: Family Medicine

## 2021-09-03 DIAGNOSIS — Z6379 Other stressful life events affecting family and household: Secondary | ICD-10-CM

## 2021-09-03 NOTE — Telephone Encounter (Signed)
[facility] to  have [facility] filled. Please advise [facility]

## 2021-09-03 NOTE — Telephone Encounter (Signed)
Called pt and lvm to find out if she needed xanax filled. [place]

## 2021-09-04 ENCOUNTER — Other Ambulatory Visit: Payer: Self-pay | Admitting: Physician Assistant

## 2021-09-04 DIAGNOSIS — Z6379 Other stressful life events affecting family and household: Secondary | ICD-10-CM

## 2021-09-04 MED ORDER — ALPRAZOLAM 0.25 MG PO TABS: 0.2500 mg | ORAL_TABLET | Freq: Two times a day (BID) | ORAL | 0 refills | Status: DC | PRN

## 2021-09-04 NOTE — Telephone Encounter (Signed)
Spoke with pt and she does need this medication refilled ?

## 2021-09-04 NOTE — Progress Notes (Signed)
I have logged into and reviewed this patient's PMPAware data today prior to issuing prescriptions for any controlled substances.  

## 2021-09-12 ENCOUNTER — Encounter: Payer: Self-pay | Admitting: Family Medicine

## 2021-09-20 ENCOUNTER — Other Ambulatory Visit: Payer: Self-pay | Admitting: Family Medicine

## 2021-09-20 DIAGNOSIS — I10 Essential (primary) hypertension: Secondary | ICD-10-CM

## 2021-09-25 ENCOUNTER — Other Ambulatory Visit: Payer: Self-pay | Admitting: Family Medicine

## 2021-09-25 DIAGNOSIS — I10 Essential (primary) hypertension: Secondary | ICD-10-CM

## 2021-10-02 ENCOUNTER — Other Ambulatory Visit: Payer: Self-pay | Admitting: Physician Assistant

## 2021-10-02 ENCOUNTER — Telehealth: Payer: Self-pay

## 2021-10-02 DIAGNOSIS — Z6379 Other stressful life events affecting family and household: Secondary | ICD-10-CM

## 2021-10-02 NOTE — Telephone Encounter (Signed)
Pt was called to advise of the need for an appt. Pt has appt for next week. kh ?

## 2021-10-09 ENCOUNTER — Ambulatory Visit: Payer: No Typology Code available for payment source | Admitting: Family Medicine

## 2021-10-09 ENCOUNTER — Encounter: Payer: Self-pay | Admitting: Family Medicine

## 2021-10-09 VITALS — BP 140/82 | HR 49 | Temp 97.3°F | Ht 65.0 in | Wt 236.0 lb

## 2021-10-09 DIAGNOSIS — Z1159 Encounter for screening for other viral diseases: Secondary | ICD-10-CM

## 2021-10-09 DIAGNOSIS — F17200 Nicotine dependence, unspecified, uncomplicated: Secondary | ICD-10-CM | POA: Diagnosis not present

## 2021-10-09 DIAGNOSIS — E785 Hyperlipidemia, unspecified: Secondary | ICD-10-CM

## 2021-10-09 DIAGNOSIS — K219 Gastro-esophageal reflux disease without esophagitis: Secondary | ICD-10-CM

## 2021-10-09 DIAGNOSIS — Z0000 Encounter for general adult medical examination without abnormal findings: Secondary | ICD-10-CM | POA: Diagnosis not present

## 2021-10-09 DIAGNOSIS — F341 Dysthymic disorder: Secondary | ICD-10-CM

## 2021-10-09 DIAGNOSIS — E669 Obesity, unspecified: Secondary | ICD-10-CM

## 2021-10-09 DIAGNOSIS — I10 Essential (primary) hypertension: Secondary | ICD-10-CM

## 2021-10-09 DIAGNOSIS — E7439 Other disorders of intestinal carbohydrate absorption: Secondary | ICD-10-CM

## 2021-10-09 DIAGNOSIS — J301 Allergic rhinitis due to pollen: Secondary | ICD-10-CM

## 2021-10-09 LAB — POCT URINALYSIS DIP (PROADVANTAGE DEVICE)
Bilirubin, UA: NEGATIVE
Blood, UA: NEGATIVE
Glucose, UA: NEGATIVE mg/dL
Ketones, POC UA: NEGATIVE mg/dL
Leukocytes, UA: NEGATIVE
Nitrite, UA: NEGATIVE
Protein Ur, POC: NEGATIVE mg/dL
Specific Gravity, Urine: 1.01
Urobilinogen, Ur: 0.2
pH, UA: 7 (ref 5.0–8.0)

## 2021-10-09 MED ORDER — BUPROPION HCL ER (SR) 150 MG PO TB12: 150.0000 mg | ORAL_TABLET | Freq: Two times a day (BID) | ORAL | 1 refills | Status: DC

## 2021-10-09 MED ORDER — LOSARTAN POTASSIUM-HCTZ 100-12.5 MG PO TABS: 1.0000 | ORAL_TABLET | Freq: Every day | ORAL | 3 refills | Status: DC

## 2021-10-09 MED ORDER — AMLODIPINE BESYLATE 10 MG PO TABS: 10.0000 mg | ORAL_TABLET | Freq: Every day | ORAL | 3 refills | Status: DC

## 2021-10-09 NOTE — Addendum Note (Signed)
Addended by: Renelda Loma on: 10/09/2021 12:13 PM ? ? Modules accepted: Orders ? ?

## 2021-10-09 NOTE — Progress Notes (Signed)
? ?  Subjective:  ? ? Patient ID: Dana Wilkins, female    DOB: [birth year], 51 y.o.   MRN: [id number] ? ?HPI ?She is here for complete examination.  She states that she does occasionally have difficulty with feeling very sleepy in the early afternoon but this usually occurs after a big lunch.  Explained that this is not unusual.  She continues to smoke and at this point is not interested in quitting.  She has concerns over her weight and has had a difficult time making any major changes to her lifestyle.  She does have underlying allergies and uses OTC meds with good results.  She continues on amlodipine as well as losartan/HCTZ for her blood pressure.  She does have reflux and usually uses Pepcid on a regular basis.  She then mentions that she does not necessarily always have reflux symptoms on a daily basis.  She continues to do well on her Wellbutrin.  She does use Xanax usually on a daily basis and cites various stresses causing her to need that.  Does have a history of glucose intolerance.  She been having back pain and is being followed by orthopedics.  She has had 1 epidural that she states has not been very useful.  She is married and things are going well there.  Otherwise family and social history as well as health maintenance and immunizations was reviewed.  She does see gynecology regularly. ? ? ?Review of Systems  ?All other systems reviewed and are negative. ? ?   ?Objective:  ? Physical Exam ?Alert and in no distress. Tympanic membranes and canals are normal. Pharyngeal area is normal. Neck is supple without adenopathy or thyromegaly. Cardiac exam shows a regular sinus rhythm without murmurs or gallops. Lungs are clear to auscultation. ? ? ? ? ?   ?Assessment & Plan:  ?Routine general medical examination at a health care facility - Plan: CBC with Differential/Platelet, Comprehensive metabolic panel, Lipid panel ? ?Current smoker:  ?  Encouraged her to call me when she is ready to quit smoking. ? ?Obesity  (BMI 30-39.9):  ?  Discussed diet and exercise with her in terms of cutting back on carbohydrates and increasing her physical activity.  Also discussed the use of Wegovy. ? ?Essential hypertension - Plan: CBC with Differential/Platelet, Comprehensive metabolic panel ? ?Gastroesophageal reflux disease without esophagitis: ?  Continue to use Pepcid but use it on an as-needed basis and also see if       there is a correlation between certain foods and cut back on that ? ?Hyperlipidemia, unspecified hyperlipidemia type ? ?Glucose intolerance - Plan: Comprehensive metabolic panel ? ?Dysthymia: ?Continue on present medication regimen.  Did discuss handling stress from a different perspective and to use the serenity prayer.  She seems to be stressing out over things that she has really no control over. ? ?Seasonal allergic rhinitis due to pollen: Continue with OTC medications ? ?Need for hepatitis C screening test - Plan: Hepatitis C antibody ? ? ?

## 2021-10-09 NOTE — Patient Instructions (Addendum)
20 minutes of something physical daily or 150 minutes week of something and also cut back on carbohydrates and easiest way to remember that is "white food" ?Use the serenity prayer ?

## 2021-10-10 LAB — CBC WITH DIFFERENTIAL/PLATELET
Basophils Absolute: 0.1 10*3/uL (ref 0.0–0.2)
Basos: 1 %
EOS (ABSOLUTE): 0.1 10*3/uL (ref 0.0–0.4)
Eos: 2 %
Hematocrit: 42.9 % (ref 34.0–46.6)
Hemoglobin: 15 g/dL (ref 11.1–15.9)
Immature Grans (Abs): 0 10*3/uL (ref 0.0–0.1)
Immature Granulocytes: 0 %
Lymphocytes Absolute: 1.4 10*3/uL (ref 0.7–3.1)
Lymphs: 23 %
MCH: 30.1 pg (ref 26.6–33.0)
MCHC: 35 g/dL (ref 31.5–35.7)
MCV: 86 fL (ref 79–97)
Monocytes Absolute: 0.6 10*3/uL (ref 0.1–0.9)
Monocytes: 9 %
Neutrophils Absolute: 4.2 10*3/uL (ref 1.4–7.0)
Neutrophils: 65 %
Platelets: 361 10*3/uL (ref 150–450)
RBC: 4.99 x10E6/uL (ref 3.77–5.28)
RDW: 13 % (ref 11.7–15.4)
WBC: 6.4 10*3/uL (ref 3.4–10.8)

## 2021-10-10 LAB — COMPREHENSIVE METABOLIC PANEL
ALT: 26 IU/L (ref 0–32)
AST: 18 IU/L (ref 0–40)
Albumin/Globulin Ratio: 2.4 — ABNORMAL HIGH (ref 1.2–2.2)
Albumin: 4.7 g/dL (ref 3.8–4.9)
Alkaline Phosphatase: 67 IU/L (ref 44–121)
BUN/Creatinine Ratio: 12 (ref 9–23)
BUN: 9 mg/dL (ref 6–24)
Bilirubin Total: 0.4 mg/dL (ref 0.0–1.2)
CO2: 23 mmol/L (ref 20–29)
Calcium: 9.7 mg/dL (ref 8.7–10.2)
Chloride: 104 mmol/L (ref 96–106)
Creatinine, Ser: 0.77 mg/dL (ref 0.57–1.00)
Globulin, Total: 2 g/dL (ref 1.5–4.5)
Glucose: 104 mg/dL — ABNORMAL HIGH (ref 70–99)
Potassium: 4.2 mmol/L (ref 3.5–5.2)
Sodium: 141 mmol/L (ref 134–144)
Total Protein: 6.7 g/dL (ref 6.0–8.5)
eGFR: 93 mL/min/{1.73_m2} (ref >59)

## 2021-10-10 LAB — LIPID PANEL
Chol/HDL Ratio: 5.6 ratio — ABNORMAL HIGH (ref 0.0–4.4)
Cholesterol, Total: 209 mg/dL — ABNORMAL HIGH (ref 100–199)
HDL: 37 mg/dL — ABNORMAL LOW (ref >39)
LDL Chol Calc (NIH): 149 mg/dL — ABNORMAL HIGH (ref 0–99)
Triglycerides: 127 mg/dL (ref 0–149)
VLDL Cholesterol Cal: 23 mg/dL (ref 5–40)

## 2021-10-10 LAB — HEPATITIS C ANTIBODY: Hep C Virus Ab: NONREACTIVE

## 2021-12-17 ENCOUNTER — Other Ambulatory Visit: Payer: Self-pay | Admitting: Family Medicine

## 2021-12-17 DIAGNOSIS — I10 Essential (primary) hypertension: Secondary | ICD-10-CM

## 2021-12-20 ENCOUNTER — Other Ambulatory Visit: Payer: Self-pay | Admitting: Family Medicine

## 2021-12-20 DIAGNOSIS — I10 Essential (primary) hypertension: Secondary | ICD-10-CM

## 2022-03-10 ENCOUNTER — Other Ambulatory Visit: Payer: Self-pay | Admitting: Family Medicine

## 2022-03-10 DIAGNOSIS — I10 Essential (primary) hypertension: Secondary | ICD-10-CM

## 2022-03-12 ENCOUNTER — Encounter: Payer: Self-pay | Admitting: Internal Medicine

## 2022-04-15 ENCOUNTER — Encounter: Payer: Self-pay | Admitting: Internal Medicine

## 2022-04-28 ENCOUNTER — Encounter: Payer: Self-pay | Admitting: Internal Medicine

## 2022-06-11 IMAGING — MR MR LUMBAR SPINE W/O CM
4 of 5 series · 26 of 48 positions shown · non-contrast
Comparison: Radiography 12/13/2019

CLINICAL DATA: Left hip and leg pain over the last year.

EXAM:
MRI LUMBAR SPINE WITHOUT CONTRAST
TECHNIQUE: Multiplanar, multisequence MR imaging of the lumbar spine was
performed. No intravenous contrast was administered.

[Series 4: T1 · sagittal · 4.0mm · 0.55mm/px · 5 of 13 slices shown (1 of 2)]
[im 1/13]
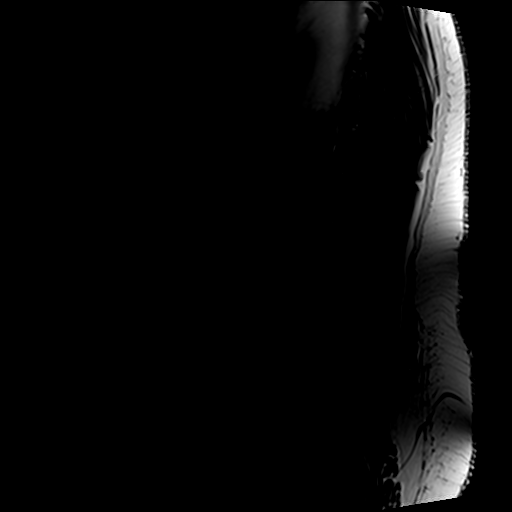
[im 4/13]
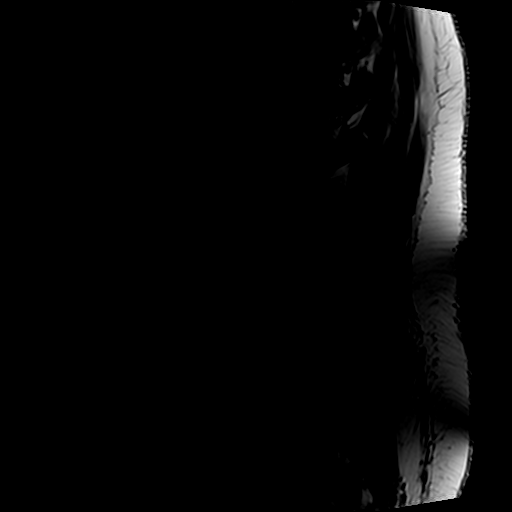
[im 7/13]
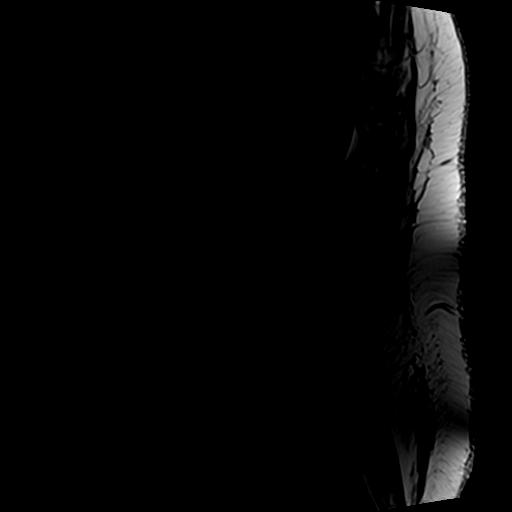
[im 10/13]
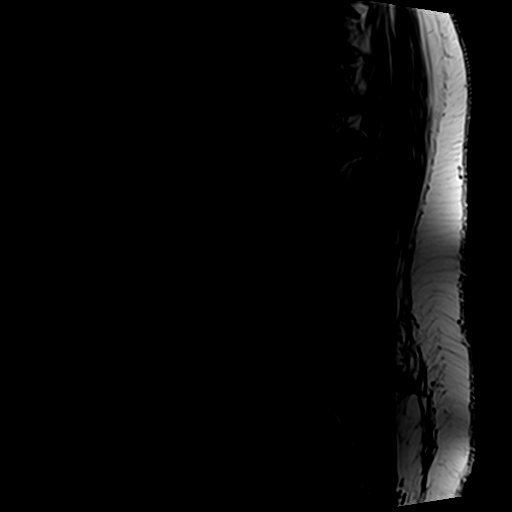
[im 13/13]
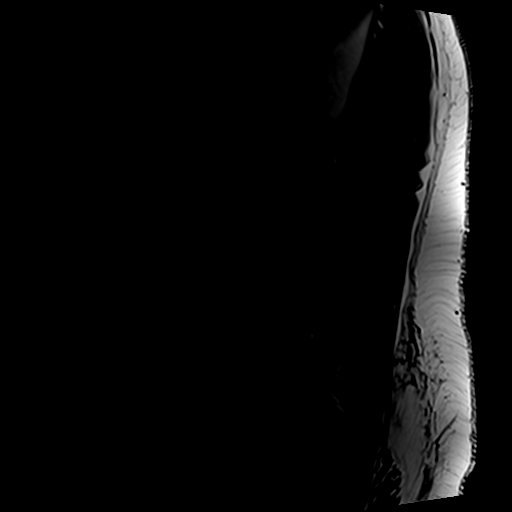

[Series 5: T2 post-contrast · sagittal · 4.0mm · 0.55mm/px · 5 of 13 slices shown]
[im 1/13]
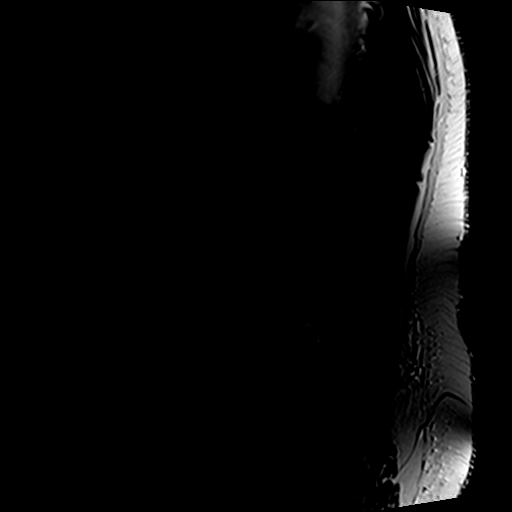
[im 4/13]
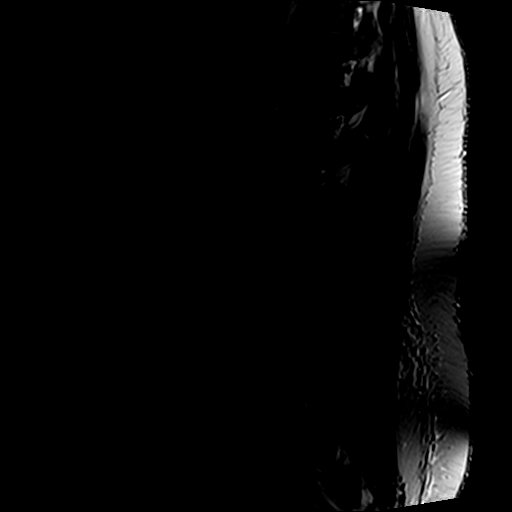
[im 7/13]
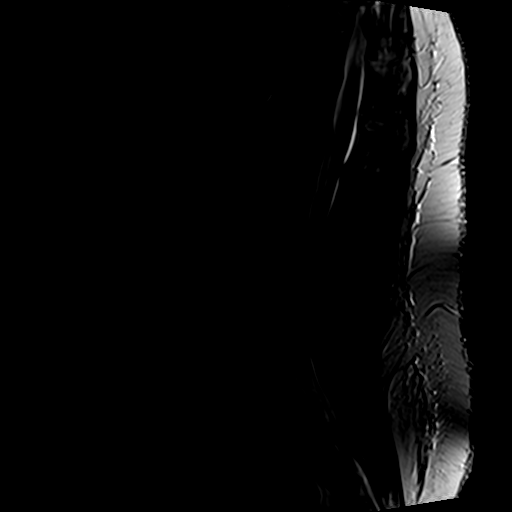
[im 10/13]
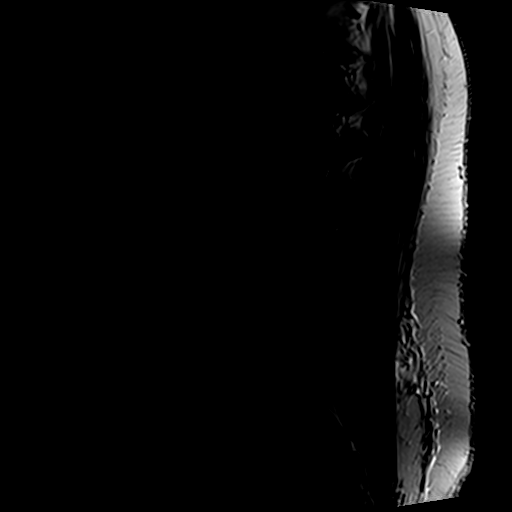
[im 13/13]
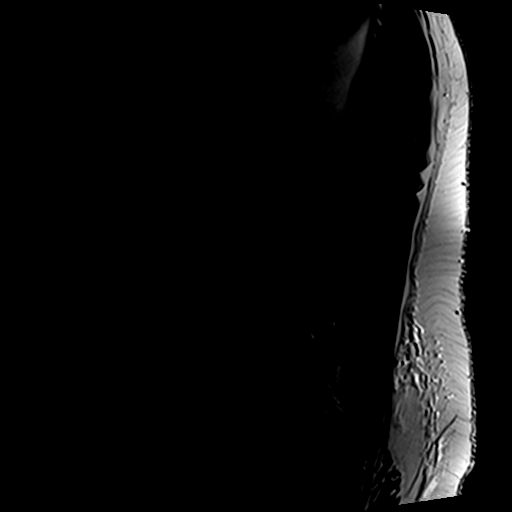

[Series 6: T2 · axial · 4.0mm · 0.70mm/px · z∈[-40,+154]mm · 10 of 39 slices shown]
[im 3/39]
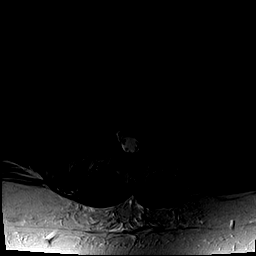
[im 6/39]
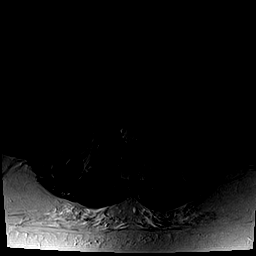
[im 8/39]
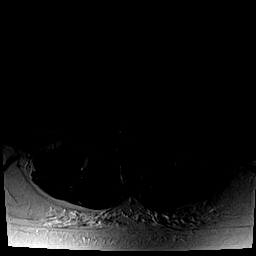
[im 13/39]
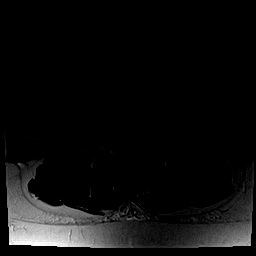
[im 18/39]
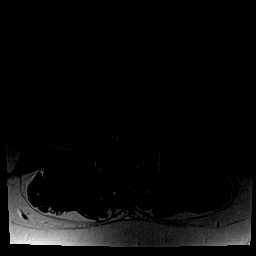
[im 21/39]
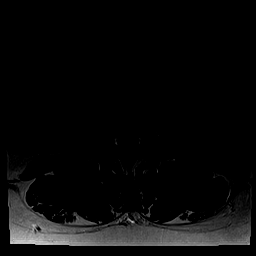
[im 23/39]
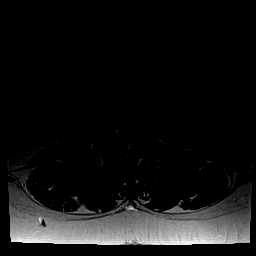
[im 28/39]
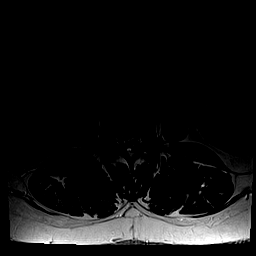
[im 33/39]
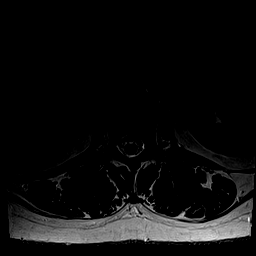
[im 39/39]
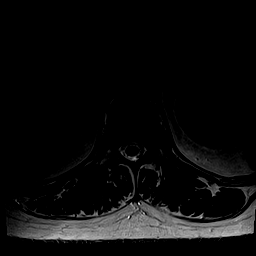

[Series 7: T1 · axial · 4.0mm · 0.35mm/px · z∈[-40,+122]mm · 6 of 39 slices shown (2 of 2)]
[im 3/39]
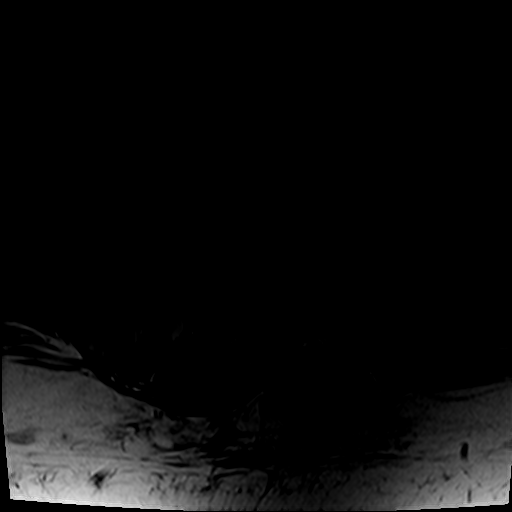
[im 6/39]
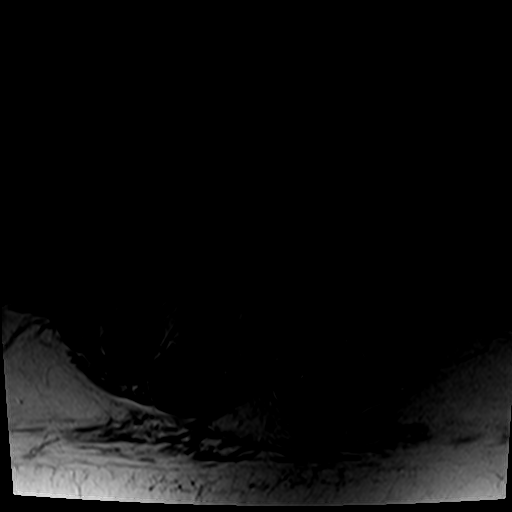
[im 8/39]
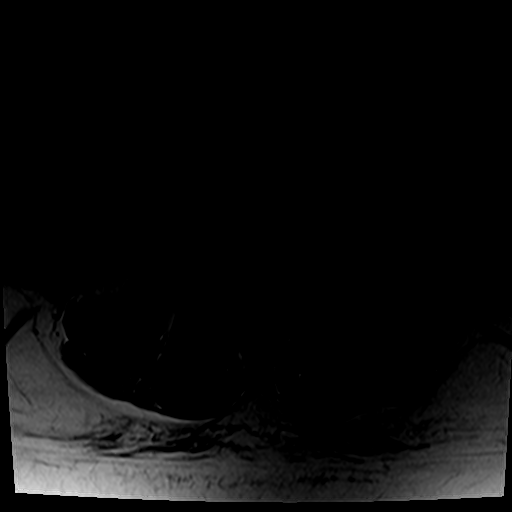
[im 13/39]
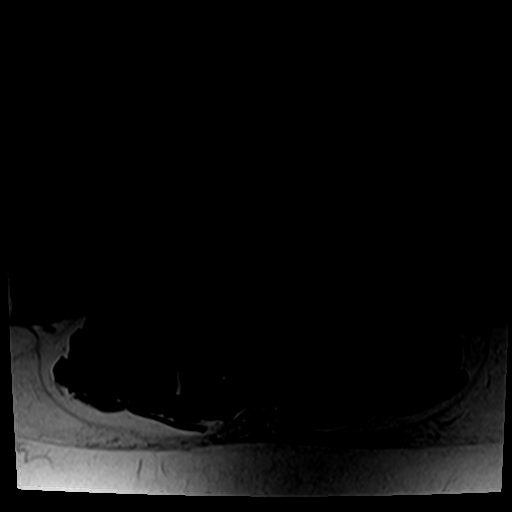
[im 21/39]
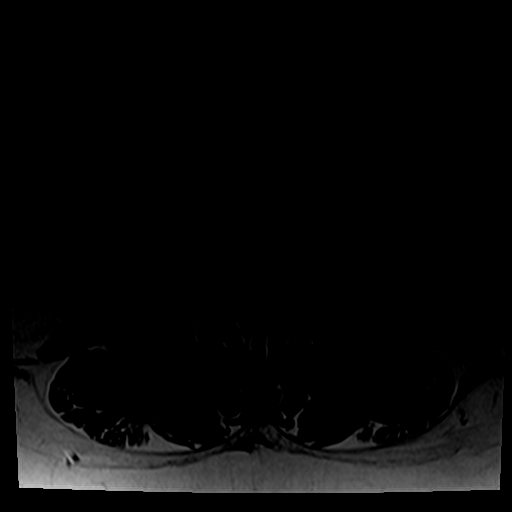
[im 33/39]
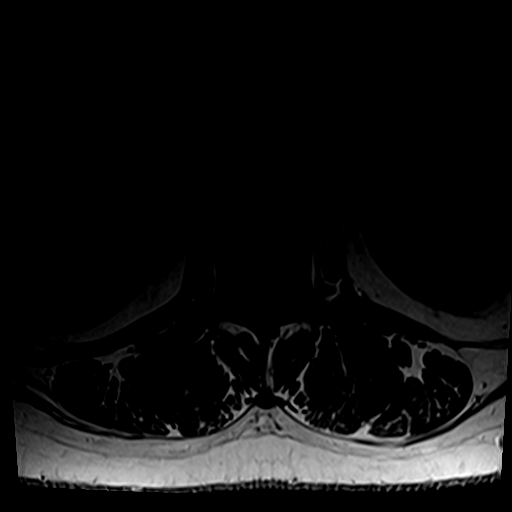

[26 of 48 positions shown; findings below may reference images not displayed]

FINDINGS: Segmentation:  5 lumbar type vertebral bodies assumed.

Alignment: Minimal curvature convex to the right with the apex at
L3.

Vertebrae:  No fracture or primary bone lesion.

Conus medullaris and cauda equina: Conus extends to the L2 level.
Conus and cauda equina appear normal.

Paraspinal and other soft tissues: Negative

Disc levels:

L1-2: Minimal noncompressive disc bulge.

L2-3: Normal interspace.

L3-4: Normal interspace.

L4-5: Disc protrusion in the central to left posterolateral
direction. Mild facet and ligamentous prominence. Stenosis of the
lateral recesses left more than right. Left L5 nerve root
compression likely occurs in the lateral recess. Minor left
foraminal encroachment by disc material could also affect the L4
nerve.

L5-S1: Bulging of the disc. Focally prominent in the right
extraforaminal region. Mild facet osteoarthritis. No compressive
canal stenosis. Some potential the right L5 could be irritated by
the extraforaminal disease.
IMPRESSION: L4-5: Broad-based disc protrusion more prominent towards the left.
Stenosis of the lateral recess likely to compress the left L5 nerve.
Mild right foraminal encroachment by disc material could also affect
the exiting L4 nerve, though less likely.

L5-S1: Disc bulge with right extraforaminal prominence. Some
potential this could irritate the adjacent right L5 nerve, but nerve
compression is not demonstrated.

Facet osteoarthritis at L4-5 and L5-S1 that could contribute to low
back pain.

## 2022-06-16 ENCOUNTER — Other Ambulatory Visit: Payer: Self-pay | Admitting: Family Medicine

## 2022-06-16 DIAGNOSIS — I10 Essential (primary) hypertension: Secondary | ICD-10-CM

## 2022-07-03 ENCOUNTER — Ambulatory Visit: Payer: No Typology Code available for payment source | Admitting: Family Medicine

## 2022-07-03 ENCOUNTER — Encounter: Payer: Self-pay | Admitting: Family Medicine

## 2022-07-03 VITALS — BP 152/78 | HR 77 | Temp 98.1°F | Wt 227.2 lb

## 2022-07-03 DIAGNOSIS — R300 Dysuria: Secondary | ICD-10-CM

## 2022-07-03 DIAGNOSIS — N3000 Acute cystitis without hematuria: Secondary | ICD-10-CM | POA: Diagnosis not present

## 2022-07-03 LAB — POCT URINALYSIS DIP (PROADVANTAGE DEVICE)
Bilirubin, UA: NEGATIVE
Blood, UA: NEGATIVE
Glucose, UA: NEGATIVE mg/dL
Ketones, POC UA: NEGATIVE mg/dL
Nitrite, UA: NEGATIVE
Protein Ur, POC: NEGATIVE mg/dL
Specific Gravity, Urine: 1.005
Urobilinogen, Ur: 0.2
pH, UA: 7 (ref 5.0–8.0)

## 2022-07-03 MED ORDER — SULFAMETHOXAZOLE-TRIMETHOPRIM 800-160 MG PO TABS: 1.0000 | ORAL_TABLET | Freq: Two times a day (BID) | ORAL | 0 refills | Status: DC

## 2022-07-03 NOTE — Progress Notes (Signed)
   Subjective:    Patient ID: Dana Wilkins, female    DOB: 26-Sep-1970, 51 y.o.   MRN: [id number]  HPI She has a 10-day history of difficulty with urgency, frequency, right lower quadrant discomfort as well as foul odor to her urine.   Review of Systems     Objective:   Physical Exam Urine dipstick did show leukocytes however microscopic contaminated        Assessment & Plan:  Dysuria - Plan: POCT Urinalysis DIP (Proadvantage Device)  Acute cystitis without hematuria - Plan: sulfamethoxazole-trimethoprim (BACTRIM DS) 800-160 MG tablet Her symptoms are consistent with UTI and I will therefore treat her.

## 2022-07-21 ENCOUNTER — Telehealth: Payer: Self-pay | Admitting: Family Medicine

## 2022-07-21 NOTE — Telephone Encounter (Signed)
Pt states UTI got a little better but after stopping the antibiotic got worse again,  urgency is back, back/side/abd pain, no blood, is cloudy, some burning.  Has not used the AZO recently.  She would like another antibiotic called in.

## 2022-07-22 ENCOUNTER — Telehealth: Payer: Self-pay | Admitting: Family Medicine

## 2022-07-22 MED ORDER — NITROFURANTOIN MONOHYD MACRO 100 MG PO CAPS: 100.0000 mg | ORAL_CAPSULE | Freq: Two times a day (BID) | ORAL | 0 refills | Status: DC

## 2022-07-22 NOTE — Telephone Encounter (Signed)
Called Dana Wilkins and informed of Dr. Lanice Shirts message about Macrobid

## 2022-07-24 NOTE — Telephone Encounter (Signed)
Pt informed

## 2022-08-07 ENCOUNTER — Encounter: Payer: Self-pay | Admitting: Family Medicine

## 2022-08-07 ENCOUNTER — Ambulatory Visit: Payer: No Typology Code available for payment source | Admitting: Family Medicine

## 2022-08-07 VITALS — BP 138/68 | HR 80 | Temp 98.0°F | Resp 14 | Wt 224.8 lb

## 2022-08-07 DIAGNOSIS — R3915 Urgency of urination: Secondary | ICD-10-CM | POA: Diagnosis not present

## 2022-08-07 LAB — POCT URINALYSIS DIP (PROADVANTAGE DEVICE)
Bilirubin, UA: NEGATIVE
Blood, UA: NEGATIVE
Glucose, UA: NEGATIVE mg/dL
Ketones, POC UA: NEGATIVE mg/dL
Leukocytes, UA: NEGATIVE
Nitrite, UA: NEGATIVE
Protein Ur, POC: NEGATIVE mg/dL
Specific Gravity, Urine: 1.015
Urobilinogen, Ur: 0.2
pH, UA: 6 (ref 5.0–8.0)

## 2022-08-07 MED ORDER — SULFAMETHOXAZOLE-TRIMETHOPRIM 800-160 MG PO TABS: 1.0000 | ORAL_TABLET | Freq: Two times a day (BID) | ORAL | 0 refills | Status: DC

## 2022-08-07 NOTE — Progress Notes (Signed)
   Subjective:    Patient ID: Dana Wilkins, female    DOB: 1971-06-14, 52 y.o.   MRN: [id number]  HPI She was originally treated December 28 for UTI symptoms and given Septra.  Apparently this did help a week after she finished that she started having difficulty again and I called in [place].  She is still complaining of urgency as well as some slight dysuria.   Review of Systems     Objective:   Physical Exam Alert and in no distress.  Abdominal exam shows no masses or tenderness. Urine dipstick is negative.      Assessment & Plan:  Urinary urgency - Plan: POCT Urinalysis DIP (Proadvantage Device), Urine Culture, sulfamethoxazole-trimethoprim (BACTRIM DS) 800-160 MG tablet I decided to place her back on Septra and do a culture.  If the culture is negative we will probably need to refer to urology.

## 2022-08-09 LAB — URINE CULTURE

## 2022-08-10 MED ORDER — CEPHALEXIN 500 MG PO CAPS: 500.0000 mg | ORAL_CAPSULE | Freq: Three times a day (TID) | ORAL | 0 refills | Status: DC

## 2022-08-10 NOTE — Addendum Note (Signed)
Addended by: Denita Lung on: 08/10/2022 08:29 AM   Modules accepted: Orders

## 2022-08-14 ENCOUNTER — Telehealth: Payer: No Typology Code available for payment source | Admitting: Family Medicine

## 2022-08-14 ENCOUNTER — Encounter: Payer: Self-pay | Admitting: Family Medicine

## 2022-08-14 VITALS — Temp 98.8°F

## 2022-08-14 DIAGNOSIS — N3000 Acute cystitis without hematuria: Secondary | ICD-10-CM

## 2022-08-14 MED ORDER — NITROFURANTOIN MONOHYD MACRO 100 MG PO CAPS: 100.0000 mg | ORAL_CAPSULE | Freq: Two times a day (BID) | ORAL | 0 refills | Status: DC

## 2022-08-14 NOTE — Progress Notes (Signed)
   Subjective:    Patient ID: Dana Wilkins, female    DOB: 1971/01/05, 52 y.o.   MRN: [id number]  HPI Documentation for virtual audio and video telecommunications through [place] encounter: The patient was located at home. 2 patient identifiers used.  The provider was located in the office. The patient did consent to this visit and is aware of possible charges through their insurance for this visit. The other persons participating in this telemedicine service were none. Time spent on call was 5 minutes and in review of previous records >20 minutes total for counseling and coordination of care. This virtual service is not related to other E/M service within previous 7 days.  She was recently started on Keflex because of issues with penicillin causing nausea and diarrhea.  She been on that for several days for treatment of UTI and is having difficulty with fever and chills and malaise.  Review of Systems     Objective:   Physical Exam Alert and in no distress otherwise not examined       Assessment & Plan:  Acute cystitis without hematuria - Plan: nitrofurantoin, macrocrystal-monohydrate, (MACROBID) 100 MG capsule She does have evidence of group B strep and research indicates that should work on it.

## 2022-08-28 ENCOUNTER — Encounter: Payer: Self-pay | Admitting: Family Medicine

## 2022-08-28 DIAGNOSIS — Z6379 Other stressful life events affecting family and household: Secondary | ICD-10-CM

## 2022-08-28 MED ORDER — ALPRAZOLAM 0.25 MG PO TABS: 0.2500 mg | ORAL_TABLET | Freq: Two times a day (BID) | ORAL | 1 refills | Status: DC | PRN

## 2022-09-14 ENCOUNTER — Other Ambulatory Visit: Payer: Self-pay | Admitting: Family Medicine

## 2022-09-14 DIAGNOSIS — I10 Essential (primary) hypertension: Secondary | ICD-10-CM

## 2022-09-15 NOTE — Telephone Encounter (Signed)
Has an appt in April 

## 2022-09-16 ENCOUNTER — Other Ambulatory Visit: Payer: Self-pay | Admitting: Family Medicine

## 2022-09-16 DIAGNOSIS — I10 Essential (primary) hypertension: Secondary | ICD-10-CM

## 2022-10-14 ENCOUNTER — Encounter: Payer: No Typology Code available for payment source | Admitting: Family Medicine

## 2022-10-17 ENCOUNTER — Other Ambulatory Visit: Payer: Self-pay | Admitting: Family Medicine

## 2022-10-17 DIAGNOSIS — I10 Essential (primary) hypertension: Secondary | ICD-10-CM

## 2022-10-17 MED ORDER — AMLODIPINE BESYLATE 10 MG PO TABS: 10.0000 mg | ORAL_TABLET | Freq: Every day | ORAL | 0 refills | Status: DC

## 2022-10-17 MED ORDER — LOSARTAN POTASSIUM-HCTZ 100-12.5 MG PO TABS: 1.0000 | ORAL_TABLET | Freq: Every day | ORAL | 0 refills | Status: DC

## 2022-11-05 ENCOUNTER — Encounter: Payer: Self-pay | Admitting: Family Medicine

## 2022-11-05 ENCOUNTER — Ambulatory Visit (INDEPENDENT_AMBULATORY_CARE_PROVIDER_SITE_OTHER): Payer: No Typology Code available for payment source | Admitting: Family Medicine

## 2022-11-05 VITALS — BP 140/74 | HR 63 | Temp 97.8°F | Resp 16 | Ht 65.0 in | Wt 220.0 lb

## 2022-11-05 DIAGNOSIS — E669 Obesity, unspecified: Secondary | ICD-10-CM | POA: Diagnosis not present

## 2022-11-05 DIAGNOSIS — I10 Essential (primary) hypertension: Secondary | ICD-10-CM

## 2022-11-05 DIAGNOSIS — K219 Gastro-esophageal reflux disease without esophagitis: Secondary | ICD-10-CM

## 2022-11-05 DIAGNOSIS — F341 Dysthymic disorder: Secondary | ICD-10-CM

## 2022-11-05 DIAGNOSIS — E7439 Other disorders of intestinal carbohydrate absorption: Secondary | ICD-10-CM

## 2022-11-05 DIAGNOSIS — Z23 Encounter for immunization: Secondary | ICD-10-CM

## 2022-11-05 DIAGNOSIS — F17200 Nicotine dependence, unspecified, uncomplicated: Secondary | ICD-10-CM

## 2022-11-05 DIAGNOSIS — Z6379 Other stressful life events affecting family and household: Secondary | ICD-10-CM

## 2022-11-05 DIAGNOSIS — Z131 Encounter for screening for diabetes mellitus: Secondary | ICD-10-CM

## 2022-11-05 DIAGNOSIS — J301 Allergic rhinitis due to pollen: Secondary | ICD-10-CM

## 2022-11-05 DIAGNOSIS — Z0000 Encounter for general adult medical examination without abnormal findings: Secondary | ICD-10-CM | POA: Diagnosis not present

## 2022-11-05 DIAGNOSIS — E785 Hyperlipidemia, unspecified: Secondary | ICD-10-CM

## 2022-11-05 LAB — POCT URINALYSIS DIP (PROADVANTAGE DEVICE)
Bilirubin, UA: NEGATIVE
Blood, UA: NEGATIVE
Glucose, UA: NEGATIVE mg/dL
Ketones, POC UA: NEGATIVE mg/dL
Leukocytes, UA: NEGATIVE
Nitrite, UA: NEGATIVE
Protein Ur, POC: NEGATIVE mg/dL
Specific Gravity, Urine: 1.005
Urobilinogen, Ur: 0.2
pH, UA: 6.5 (ref 5.0–8.0)

## 2022-11-05 LAB — POCT GLYCOSYLATED HEMOGLOBIN (HGB A1C): Hemoglobin A1C: 5.8 % — AB (ref 4.0–5.6)

## 2022-11-05 MED ORDER — AZELASTINE HCL 0.1 % NA SOLN
2.0000 | Freq: Two times a day (BID) | NASAL | 5 refills | Status: DC
Start: 2022-11-05 — End: 2023-04-21

## 2022-11-05 MED ORDER — LOSARTAN POTASSIUM-HCTZ 100-12.5 MG PO TABS: 1.0000 | ORAL_TABLET | Freq: Every day | ORAL | 3 refills | Status: AC

## 2022-11-05 MED ORDER — AMLODIPINE BESYLATE 10 MG PO TABS: 10.0000 mg | ORAL_TABLET | Freq: Every day | ORAL | 3 refills | Status: AC

## 2022-11-05 NOTE — Progress Notes (Signed)
Complete physical exam  Patient: Dana Wilkins   DOB: 1970/11/06   52 y.o. Female  MRN: [id number]  Subjective:    Chief Complaint  Patient presents with   Annual Exam    Fasting x 6 hours.    Dana Wilkins is a 52 y.o. female who presents today for a complete physical exam. She reports consuming a general diet. Home exercise routine includes walking and yardwork. She generally feels well. She reports sleeping poorly. She does have additional problems to discuss today (chest spasms, ear fullness, congestion and facial tenderness x 1 week) she also is dealing with sneezing, itchy watery eyes, chest congestion.  She is on Flonase and Claritin.  She has also had some difficulty with eructation separate from this but has not so far been able to associate this with anything.  She stopped taking her Wellbutrin about a year ago and psychologically has done fairly well in spite of the fact that her husband is now on hospice.  She seems to be handling this well and appreciates hospice help.  She does use Xanax on an as-needed basis.  She continues to smoke.  She continues on her blood pressure medications.  She does have reflux and does use Pepcid.  Otherwise family and social history as well as health maintenance and immunizations was reviewed. She follows up with gynecology concerning mammogram, Pap and pelvic.  Most recent fall risk assessment:    08/07/2022    1:42 PM  Fall Risk   Falls in the past year? 0  Number falls in past yr: 0  Injury with Fall? 0  Risk for fall due to : No Fall Risks  Follow up Falls evaluation completed     Most recent depression screenings:    11/05/2022    1:40 PM 08/07/2022    1:42 PM  PHQ 2/9 Scores  PHQ - 2 Score 3 0  PHQ- 9 Score 7 2    Vision:Within last year and Dental: Receives regular dental care    Patient Care Team: Ronnald Nian, MD as PCP - General (Family Medicine)   Outpatient Medications Prior to Visit  Medication Sig   alclomethasone  (ACLOVATE) 0.05 % cream Apply 1 application  topically 2 (two) times daily.   ALPRAZolam (XANAX) 0.25 MG tablet Take 1 tablet (0.25 mg total) by mouth 2 (two) times daily as needed for anxiety.   augmented betamethasone dipropionate (DIPROLENE-AF) 0.05 % cream Apply topically.   fluticasone (FLONASE) 50 MCG/ACT nasal spray SMARTSIG:2 Spray(s) Both Nares Every Night   ibuprofen (ADVIL,MOTRIN) 200 MG tablet Take 200 mg by mouth every 6 (six) hours as needed.   loratadine (CLARITIN) 10 MG tablet Take 10 mg by mouth daily.   buPROPion (WELLBUTRIN SR) 150 MG 12 hr tablet Take 1 tablet (150 mg total) by mouth 2 (two) times daily. (Patient not taking: Reported on 11/05/2022)   Cholecalciferol (VITAMIN D3 PO) Take by mouth. (Patient not taking: Reported on 11/05/2022)   famotidine (PEPCID) 20 MG tablet Take 20 mg by mouth 2 (two) times daily. (Patient not taking: Reported on 11/05/2022)   [DISCONTINUED] amLODipine (NORVASC) 10 MG tablet Take 1 tablet (10 mg total) by mouth daily.   [DISCONTINUED] cephALEXin (KEFLEX) 500 MG capsule Take 1 capsule (500 mg total) by mouth 3 (three) times daily.   [DISCONTINUED] losartan-hydrochlorothiazide (HYZAAR) 100-12.5 MG tablet Take 1 tablet by mouth daily.   [DISCONTINUED] methocarbamol (ROBAXIN) 500 MG tablet Take 1 tablet (500 mg total) by mouth  3 (three) times daily. (Patient not taking: Reported on 11/05/2022)   [DISCONTINUED] nitrofurantoin, macrocrystal-monohydrate, (MACROBID) 100 MG capsule Take 1 capsule (100 mg total) by mouth 2 (two) times daily.   No facility-administered medications prior to visit.    Review of Systems  HENT:  Positive for hearing loss.   All other systems reviewed and are negative.         Objective:  Alert and in no distress. Tympanic membranes and canals are normal. Pharyngeal area is normal. Neck is supple without adenopathy or thyromegaly. Cardiac exam shows a regular sinus rhythm without murmurs or gallops. Lungs are clear to  auscultation.    BP (!) 140/74 (BP Location: Right Arm, Cuff Size: Large)   Pulse 63   Temp 97.8 F (36.6 C) (Oral)   Resp 16   Ht 5\' 5"  (1.651 m)   Wt 220 lb (99.8 kg)   SpO2 96% Comment: room air  BMI 36.61 kg/m    Physical Exam  Alert and in no distress. Tympanic membranes and canals are normal. Pharyngeal area is normal. Neck is supple without adenopathy or thyromegaly. Cardiac exam shows a regular sinus rhythm without murmurs or gallops. Lungs are clear to auscultation. Hemoglobin A1c is 5.8. Results for orders placed or performed in visit on 11/05/22  POCT glycosylated hemoglobin (Hb A1C)  Result Value Ref Range   Hemoglobin A1C 5.8 (A) 4.0 - 5.6 %  POCT Urinalysis DIP (Proadvantage Device)  Result Value Ref Range   Color, UA yellow yellow   Clarity, UA clear clear   Glucose, UA negative negative mg/dL   Bilirubin, UA negative negative   Ketones, POC UA negative negative mg/dL   Specific Gravity, Urine 1.005    Blood, UA negative negative   pH, UA 6.5 5.0 - 8.0   Protein Ur, POC negative negative mg/dL   Urobilinogen, Ur 0.2    Nitrite, UA Negative Negative   Leukocytes, UA Negative Negative       Assessment & Plan:    Routine general medical examination at a health care facility - Plan: POCT Urinalysis DIP (Proadvantage Device)  Essential hypertension - Plan: amLODipine (NORVASC) 10 MG tablet, losartan-hydrochlorothiazide (HYZAAR) 100-12.5 MG tablet, CBC with Differential/Platelet, Comprehensive metabolic panel  Seasonal allergic rhinitis due to pollen - Plan: azelastine (ASTELIN) 0.1 % nasal spray  Gastroesophageal reflux disease without esophagitis  Obesity (BMI 30-39.9)  Hyperlipidemia, unspecified hyperlipidemia type - Plan: Lipid panel  Dysthymia  Glucose intolerance  Smoker  Need for shingles vaccine - Plan: Varicella-zoster vaccine IM  Screening for diabetes mellitus - Plan: Comprehensive metabolic panel, POCT glycosylated hemoglobin (Hb  A1C)  Stress due to illness of family member   Immunization History  Administered Date(s) Administered   Influenza Inj Mdck Quad Pf 03/26/2018   Influenza,inj,Quad PF,6+ Mos 04/17/2017, 04/07/2019   Influenza-Unspecified 04/06/2018, 03/07/2020, 03/20/2021   PFIZER(Purple Top)SARS-COV-2 Vaccination 10/03/2019, 10/25/2019   Tdap 08/06/2017    Health Maintenance  Topic Date Due   HIV Screening  Never done   Lung Cancer Screening  Never done   Zoster Vaccines- Shingrix (1 of 2) Never done   COVID-19 Vaccine (3 - 2023-24 season) 03/07/2022   PAP SMEAR-Modifier  09/09/2022   MAMMOGRAM  11/30/2022   INFLUENZA VACCINE  02/05/2023   Fecal DNA (Cologuard)  10/10/2023   DTaP/Tdap/Td (2 - Td or Tdap) 08/07/2027   Hepatitis C Screening  Completed   HPV VACCINES  Aged Out    Discussed health benefits of physical activity, and encouraged her  to engage in regular exercise appropriate for her age and condition.  Problem List Items Addressed This Visit     Dysthymia   Essential hypertension   Relevant Medications   amLODipine (NORVASC) 10 MG tablet   losartan-hydrochlorothiazide (HYZAAR) 100-12.5 MG tablet   Other Relevant Orders   CBC with Differential/Platelet   Comprehensive metabolic panel   Gastroesophageal reflux disease without esophagitis   Glucose intolerance   Hyperlipidemia   Relevant Medications   amLODipine (NORVASC) 10 MG tablet   losartan-hydrochlorothiazide (HYZAAR) 100-12.5 MG tablet   Other Relevant Orders   Lipid panel   Obesity (BMI 30-39.9)   Seasonal allergic rhinitis due to pollen   Relevant Medications   azelastine (ASTELIN) 0.1 % nasal spray   Other Visit Diagnoses     Routine general medical examination at a health care facility    -  Primary   Relevant Orders   POCT Urinalysis DIP (Proadvantage Device) (Completed)   Smoker       Need for shingles vaccine       Relevant Orders   Varicella-zoster vaccine IM   Screening for diabetes mellitus        Relevant Orders   Comprehensive metabolic panel   POCT glycosylated hemoglobin (Hb A1C) (Completed)   Stress due to illness of family member          Overall think she is doing fairly well.  I will give her Astelin.  I did not discuss smoking cessation with her at this time.  Encouraged her to continue to let hospice help which she is doing.  She is very grateful of them.    Sharlot Gowda, MD

## 2022-11-06 LAB — CBC WITH DIFFERENTIAL/PLATELET
Basophils Absolute: 0.1 10*3/uL (ref 0.0–0.2)
Basos: 1 %
EOS (ABSOLUTE): 0.1 10*3/uL (ref 0.0–0.4)
Eos: 1 %
Hematocrit: 46.9 % — ABNORMAL HIGH (ref 34.0–46.6)
Hemoglobin: 15.7 g/dL (ref 11.1–15.9)
Immature Grans (Abs): 0 10*3/uL (ref 0.0–0.1)
Immature Granulocytes: 0 %
Lymphocytes Absolute: 1.8 10*3/uL (ref 0.7–3.1)
Lymphs: 18 %
MCH: 29.6 pg (ref 26.6–33.0)
MCHC: 33.5 g/dL (ref 31.5–35.7)
MCV: 89 fL (ref 79–97)
Monocytes Absolute: 0.5 10*3/uL (ref 0.1–0.9)
Monocytes: 5 %
Neutrophils Absolute: 7.4 10*3/uL — ABNORMAL HIGH (ref 1.4–7.0)
Neutrophils: 75 %
Platelets: 363 10*3/uL (ref 150–450)
RBC: 5.3 x10E6/uL — ABNORMAL HIGH (ref 3.77–5.28)
RDW: 13.2 % (ref 11.7–15.4)
WBC: 10 10*3/uL (ref 3.4–10.8)

## 2022-11-06 LAB — COMPREHENSIVE METABOLIC PANEL
ALT: 15 IU/L (ref 0–32)
AST: 15 IU/L (ref 0–40)
Albumin/Globulin Ratio: 2 (ref 1.2–2.2)
Albumin: 4.8 g/dL (ref 3.8–4.9)
Alkaline Phosphatase: 73 IU/L (ref 44–121)
BUN/Creatinine Ratio: 11 (ref 9–23)
BUN: 11 mg/dL (ref 6–24)
Bilirubin Total: 0.5 mg/dL (ref 0.0–1.2)
CO2: 24 mmol/L (ref 20–29)
Calcium: 10.4 mg/dL — ABNORMAL HIGH (ref 8.7–10.2)
Chloride: 100 mmol/L (ref 96–106)
Creatinine, Ser: 1.01 mg/dL — ABNORMAL HIGH (ref 0.57–1.00)
Globulin, Total: 2.4 g/dL (ref 1.5–4.5)
Glucose: 92 mg/dL (ref 70–99)
Potassium: 4.7 mmol/L (ref 3.5–5.2)
Sodium: 140 mmol/L (ref 134–144)
Total Protein: 7.2 g/dL (ref 6.0–8.5)
eGFR: 67 mL/min/{1.73_m2} (ref >59)

## 2022-11-06 LAB — LIPID PANEL
Chol/HDL Ratio: 5.4 ratio — ABNORMAL HIGH (ref 0.0–4.4)
Cholesterol, Total: 233 mg/dL — ABNORMAL HIGH (ref 100–199)
HDL: 43 mg/dL (ref >39)
LDL Chol Calc (NIH): 160 mg/dL — ABNORMAL HIGH (ref 0–99)
Triglycerides: 163 mg/dL — ABNORMAL HIGH (ref 0–149)
VLDL Cholesterol Cal: 30 mg/dL (ref 5–40)

## 2022-12-23 ENCOUNTER — Other Ambulatory Visit: Payer: Self-pay | Admitting: Family Medicine

## 2022-12-23 DIAGNOSIS — Z6379 Other stressful life events affecting family and household: Secondary | ICD-10-CM

## 2022-12-23 NOTE — Telephone Encounter (Signed)
Refill request last apt 11/05/22

## 2023-01-07 ENCOUNTER — Encounter: Payer: Self-pay | Admitting: Internal Medicine

## 2023-01-12 ENCOUNTER — Telehealth: Payer: Self-pay | Admitting: Family Medicine

## 2023-01-12 NOTE — Telephone Encounter (Signed)
Medical Records received from [facility].

## 2023-01-22 ENCOUNTER — Encounter: Payer: Self-pay | Admitting: Family Medicine

## 2023-01-23 MED ORDER — CLARITHROMYCIN 500 MG PO TABS
500.0000 mg | ORAL_TABLET | Freq: Two times a day (BID) | ORAL | 0 refills | Status: DC
Start: 2023-01-23 — End: 2023-03-04

## 2023-03-04 ENCOUNTER — Ambulatory Visit: Payer: No Typology Code available for payment source | Admitting: Family Medicine

## 2023-03-04 ENCOUNTER — Encounter: Payer: Self-pay | Admitting: Family Medicine

## 2023-03-04 VITALS — BP 126/78 | HR 75 | Temp 98.0°F | Ht 65.0 in | Wt 222.6 lb

## 2023-03-04 DIAGNOSIS — Z7189 Other specified counseling: Secondary | ICD-10-CM

## 2023-03-04 DIAGNOSIS — J0100 Acute maxillary sinusitis, unspecified: Secondary | ICD-10-CM

## 2023-03-04 MED ORDER — CLARITHROMYCIN 500 MG PO TABS
500.0000 mg | ORAL_TABLET | Freq: Two times a day (BID) | ORAL | 0 refills | Status: AC
Start: 2023-03-04 — End: ?

## 2023-03-04 NOTE — Progress Notes (Signed)
   Subjective:    Patient ID: Dana Wilkins, female    DOB: [birth year], 52 y.o.   MRN: [id number]  HPI She complains of sinus pressure, headache, rhinorrhea that has been getting worse in the last month with associated purulent drainage as well as upper tooth discomfort.  She has a previous history of difficulty with this and with the last treatment got better but did not get entirely over it.   Review of Systems     Objective:    Physical Exam Alert and in no distress.  Nasal mucosa is slightly erythematous with tenderness over frontal and maxillary sinuses.  Tympanic membranes and canals are normal. Pharyngeal area is normal. Neck is supple without adenopathy or thyromegaly. Cardiac exam shows a regular sinus rhythm without murmurs or gallops. Lungs are clear to auscultation.        Assessment & Plan:  Acute non-recurrent maxillary sinusitis - Plan: clarithromycin (BIAXIN) 500 MG tablet She is to continue on her allergy medications and add Flonase or Rhinocort.  I will give her a 2-week course and if she is not entirely better she is to call me.  She was comfortable with that.  I then discussed the death of her husband with her.  She is now going through a lot of the legal hassles of this.  She is also planning on selling her house and moving back to [place].  We discussed this in detail and I strongly suggested that she continue to seek the help of a lawyer as well as Publishing rights manager and make sure that everything is written down especially in regard to healing with her siblings and any properties associated with them.

## 2023-03-17 ENCOUNTER — Encounter: Payer: Self-pay | Admitting: Family Medicine

## 2023-03-17 ENCOUNTER — Ambulatory Visit: Payer: No Typology Code available for payment source | Admitting: Family Medicine

## 2023-03-17 VITALS — BP 116/72 | HR 79 | Wt 223.8 lb

## 2023-03-17 DIAGNOSIS — S9030XA Contusion of unspecified foot, initial encounter: Secondary | ICD-10-CM

## 2023-03-17 NOTE — Progress Notes (Signed)
   Subjective:    Patient ID: Dana Wilkins, female    DOB: 05/07/1971, 52 y.o.   MRN: [id number]  HPI She states that 8 days ago she had a near fall and landed on her right foot but is not sure how she landed.  She had no trouble for the next several days but then about 5 days ago he noted some discomfort in the metatarsal arch area.  She has been taking occasional ibuprofen.   Review of Systems     Objective:    Physical Exam Exam of the right foot shows minimal distal swelling but no point tenderness over the Achilles, heel, talus but some slight discomfort over the metatarsals especially the second and third.       Assessment & Plan:  Contusion of dorsum of foot Wear shoes that have good arch support.  Tylenol is always a first go to drug and if you need to then you can add Advil

## 2023-03-17 NOTE — Patient Instructions (Addendum)
Wear shoes that have good arch support.  Tylenol is always a first go to drug and if you need to then you can add Advil

## 2023-04-08 ENCOUNTER — Encounter: Payer: Self-pay | Admitting: Family Medicine

## 2023-04-08 DIAGNOSIS — J0100 Acute maxillary sinusitis, unspecified: Secondary | ICD-10-CM

## 2023-04-08 MED ORDER — CLARITHROMYCIN 500 MG PO TABS: 500.0000 mg | ORAL_TABLET | Freq: Two times a day (BID) | ORAL | 0 refills | Status: AC

## 2023-04-20 ENCOUNTER — Other Ambulatory Visit: Payer: Self-pay | Admitting: Family Medicine

## 2023-04-20 DIAGNOSIS — Z6379 Other stressful life events affecting family and household: Secondary | ICD-10-CM

## 2023-04-21 ENCOUNTER — Other Ambulatory Visit: Payer: Self-pay | Admitting: Family Medicine

## 2023-04-21 DIAGNOSIS — J301 Allergic rhinitis due to pollen: Secondary | ICD-10-CM

## 2023-08-26 ENCOUNTER — Other Ambulatory Visit: Payer: Self-pay | Admitting: Family Medicine

## 2023-08-26 DIAGNOSIS — Z6379 Other stressful life events affecting family and household: Secondary | ICD-10-CM

## 2023-09-01 ENCOUNTER — Encounter: Payer: Self-pay | Admitting: Internal Medicine

## 2023-10-06 ENCOUNTER — Other Ambulatory Visit: Payer: Self-pay | Admitting: Family Medicine

## 2023-10-06 DIAGNOSIS — J301 Allergic rhinitis due to pollen: Secondary | ICD-10-CM

## 2023-11-10 ENCOUNTER — Encounter: Payer: No Typology Code available for payment source | Admitting: Family Medicine

## 2023-12-25 ENCOUNTER — Other Ambulatory Visit: Payer: Self-pay | Admitting: Family Medicine

## 2023-12-25 DIAGNOSIS — Z6379 Other stressful life events affecting family and household: Secondary | ICD-10-CM
